# Patient Record
Sex: Female | Born: 1990 | Race: Black or African American | Hispanic: No | Marital: Single | Smoking: Never smoker
Health system: Southern US, Community
[De-identification: ages and names within clinical notes are randomized; demographics above are authoritative.]

## PROBLEM LIST (undated history)

## (undated) DIAGNOSIS — G510 Bell's palsy: Secondary | ICD-10-CM

---
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
## 2010-07-16 NOTE — Assessment & Plan Note (Signed)
Summary: f/u bells palsey,df   Vital Signs:  Patient profile:   20 year old female Menstrual status:  regular Height:      65.5 inches Weight:      248.5 pounds BMI:     40.87 Pulse rate:   92 / minute BP sitting:   126 / 88  (right arm)  Vitals Entered By: Renato Battles slade,cma CC: f/up ED. just finished prednisone. had Bell's palsy Is Patient Diabetic? No Pain Assessment Patient in pain? no        Primary Care Provider:  Helane Rima DO  CC:  f/up ED. just finished prednisone. had Bell's palsy.  History of Present Illness: 20 year old AAF:  1. Bell's Palsy: second episode, similar to prior episode last fall, left face droop x 1 week. Onset was acute, noticed when she awoke in the am. She went to ED and was Tx with Prednisone. Facial droop has not started to get better yet. Last episode took  ~ 3 months to resolve. Difficult to close left eye. Decreased sensation over left side of face. Neg diabetes. Denies pregnancy, rash, recent illness, other neurological deficits.  Habits & Providers  Alcohol-Tobacco-Diet     Tobacco Status: never  Current Medications (verified): 1)  None  Allergies (verified): No Known Drug Allergies  Past History:  Past Medical History: Bell's palsy 2010, 2011 Trichomonas 2010  Social History: Lives with mother and younger sister; denies tobacco, ETOH, drugs; taking driver's education this year (2010); wants to go to college to become a Engineer, civil (consulting); sexually active; does not want birth control at this time.  Review of Systems General:  Denies chills and fever. Eyes:  Denies blurring, double vision, and eye pain. CV:  Denies chest pain or discomfort. Resp:  Denies shortness of breath. GI:  Denies change in bowel habits. Neuro:  See HPI. Psych:  Denies anxiety and depression. Endo:  Denies excessive thirst and excessive urination.  Physical Exam  General:  Obese, WDWN, NAD, vital signs reviewed. Head:  Left face droop with left eyebrow  sagging, inability to completely close the left eye, disappearance of the nasolabial fold, and inability to raise left corner of mouth. Eyes:  No tearing or conjunctival injection. Ears:  TM's pearly gray with normal light reflex and landmarks, canals clear.  Neck:  Supple without adenopathy.  Lungs:  Clear to ausc, no crackles, rhonchi or wheezing, no grunting, flaring or retractions.  Heart:  RRR without murmur.  Neurologic:  alert & oriented X3, strength normal in all extremities, and DTRs symmetrical and normal.   Psych:  Alert and cooperative.    Impression & Recommendations:  Problem # 1:  BELLS PALSY (ICD-351.0) Assessment Unchanged Will send to neurologist for evaluation. Patient has already finished Prednisone burst. Discussed fairly high rate of recurrence with patient. Urine pregnancy negative. Instructions given for eye care. Orders: U Preg-[facility] ([id number]) Neurology Referral (Neuro) [facility]- Est Level  3 (02725)  Problem # 2:  OBESITY (ICD-278.00) Assessment: Unchanged  Patient Instructions: 1)  It looks like you have Bell's Palsy. Since this is your second time, I am sending you to a neurologist for evaluation.  2)  Remember that this should go away in about 6 months or less.  3)  Because your eye may not close completely, it is important that you protect it. You may use artifical tear eye drops during the day and GenTeal Gel Drops at night. You may also tape your eye closed at night. Make sure to use a  gentle, non-abrasive and easily removed tape (such as paper surgical tape).  Laboratory Results   Urine Tests  Date/Time Received: August 28, 2009 4:30 PM  Date/Time Reported: August 28, 2009 4:36 PM     Urine HCG: negative Comments: ...............test performed by......Marland KitchenBonnie A. [place], MLS (ASCP)cm

## 2010-07-16 NOTE — Progress Notes (Signed)
Summary: triage  Phone Note Call from Patient Call back at [phone]   Caller: Patient Summary of Call: pt has a "loud " smell in her urine.  no burning/no pain - wants to know if she has a uti. Initial call taken by: De Nurse,  December 02, 2009 4:40 PM  Follow-up for Phone Call        told her she would need to be seen. wanted appt thursday since that is when she can get a ride. she will come at 8:30 for a work in. advised lots of water & if any pain or discomfort starts, take tylenol or ibuprofen Follow-up by: Golden Circle RN,  December 02, 2009 4:55 PM

## 2010-07-16 NOTE — Assessment & Plan Note (Signed)
Summary: knot in left breast,tcb   Vital Signs:  Patient profile:   20 year old female Menstrual status:  regular Height:      65.5 inches Weight:      250 pounds BMI:     41.12 Temp:     98.1 degrees F oral Pulse rate:   89 / minute BP sitting:   141 / 73  (left arm) Cuff size:   large  Vitals Entered By: Tessie Fass CMA (Nov 04, 2009 11:21 AM) CC: knot in right breast Is Patient Diabetic? No Pain Assessment Patient in pain? no        Primary Care Provider:  Helane Rima DO  CC:  knot in right breast.  History of Present Illness: 20 year old here with her mother noted that her right breast was sore when she layed on it for 1 week.  Feels a small round knot that is tender.  Not on any contraception.  Habits & Providers  Alcohol-Tobacco-Diet     Tobacco Status: never  Current Medications (verified): 1)  None  Allergies (verified): No Known Drug Allergies  Review of Systems      See HPI  Physical Exam  General:  Morbidly obese 20 year old,  BMI 41 Breasts:  large breasts, 1 cm tender mass very tender and palpable beneath areola raound 5 o'clock Axillary Nodes:  no adenopathy   Impression & Recommendations:  Problem # 1:  BREAST CYST, RIGHT (ICD-610.0)  Explained natural occurance of cysts, may also be small infection.  She reports improvement.  Return in 6-8 weeeks if not completely resolved.  If it comes to a head it may need to drain, apply hot compresses.  Orders: [facility]- Est Level  3 ([address])  Problem # 2:  OBESITY (ICD-278.00)  Counseled on weight loss and the risks of such.  Orders: [facility]- Est Level  3 ([address]454)  Patient Instructions: 1)  Return in 6-8 weeks if area is not gone 2)  If it comes to a head apply heat to facilitate drainage.

## 2010-07-16 NOTE — Assessment & Plan Note (Signed)
Summary: wcc,df   Vital Signs:  Patient profile:   20 year old female Menstrual status:  regular LMP:     12/21/2008 Height:      65.5 inches Weight:      237.5 pounds BMI:     39.06 BMI percentile:   98.9 Temp:     99.4 degrees F Pulse rate:   89 / minute BP sitting:   117 / 78  (right arm)  Vitals Entered By: Jacki Cones RN (January 01, 2009 4:47 PM)  Nutrition Counseling: Patient's BMI is greater than 25 and therefore counseled on weight management options.  CC:  WCC.  CC: WCC Pain Assessment Patient in pain? no      LMP (date): 12/21/2008  years   days  Menstrual Status regular Enter LMP: 12/21/2008   Habits & Providers  Alcohol-Tobacco-Diet     Alcohol drinks/day: 0     Tobacco Status: never  Exercise-Depression-Behavior     Does Patient Exercise: no     STD Risk: never     Drug Use: never     Seat Belt Use: always  Well Child Visit/Preventive Care  Age:  20 years old female Patient lives with: mother Concerns: Saxon is a pleasant 74 year old presenting for [facility].   1. Foot Peeling: worries about dry, peeling skin on both feet, states "they a mess!," denies itching, swelling, odor, lesions, numbness/tingling, pain, nail involvement. She uses lotion to moisturize them. She denies other rashes or dry skin on the rest of her body. No polyuria, polydipsia, weight gain or loss.  Home:     good family relationships, communication between adolescent/parent, and has responsibilities at home Education:     As, Bs, and good attendance; wants to become a nurse Activities:     friends and > 2 hrs TV/Computer Auto/Safety:     seatbelts Diet:     dental hygiene/visit addressed; dentist visit one month ago, wants to lose weight Drugs:     no tobacco use, no alcohol use, and no drug use Sex:     abstinence Suicide risk:     emotionally healthy  Personal History: started menses at age 32, length between flow = 28 days, flow x 3 days, not sexually active, not  interested in birth control  Family History: Atopic Disease- Mother  Social History: Lives with mother and younger sister; denies tobacco, ETOH, drugs; taking driver's education this year (2010); wants to go to college to become a Engineer, civil (consulting); not sexually active; does not want birth control at this time.Smoking Status:  never STD Risk:  never Drug Use/Awareness:  never  Review of Systems       per HPI, otherwise negative  Physical Exam  General:      Well appearing adolescent, no acute distress, overweight Head:      normocephalic and atraumatic  Eyes:      PERRL, EOMI Ears:      TM's pearly gray with normal light reflex and landmarks, canals clear  Nose:      Clear without Rhinorrhea Mouth:      Clear without erythema, edema or exudate, mucous membranes moist Neck:      supple without adenopathy  Lungs:      Clear to ausc, no crackles, rhonchi or wheezing, no grunting, flaring or retractions  Heart:      RRR without murmur  Abdomen:      BS+, soft, non-tender, no masses, no hepatosplenomegaly  Musculoskeletal:      no  scoliosis, normal gait, normal posture Pulses:      femoral pulses present  Extremities:      Well perfused with no cyanosis or deformity noted  Neurologic:      Neurologic exam grossly intact  Developmental:      alert and cooperative  Skin:      intact without lesions, rashes  Psychiatric:      alert and cooperative   Impression & Recommendations:  Problem # 1:  Well Adolescent Exam (ICD-V20.2) Assessment Unchanged Normal WCC. Patient does not want birth control at this time. Anticipatory guidance given and questions answered. Obesity addressed (see #2). Follow up in 1 year or sooner if needed.  Problem # 2:  OBESITY (ICD-278.00) Assessment: Unchanged Encouraged regular exercise in the form of walking and light weight training 5 days/week. Discussed healthy food choices. Patient refuses meeting with nutritionist at this time. Will  monitor.  Problem # 3:  DERMATOPHYTOSIS OF FOOT (ICD-110.4) Assessment: New Mild. Advised patient to use OTC antifungals and to keep feet clean and dry. Clean shoes well and allow them to air-out before wearing again. If problem persists or worsens, will may evaluate need for oral therapy.  Other Orders: [facility] - Est  12-17 yrs (04540)  Patient Instructions: 1)  Please schedule a follow-up appointment in 1 year.  2)  You need to lose weight. Consider a lower calorie diet and regular exercise.  ]  Prevention & Chronic Care Immunizations   Influenza vaccine: Not documented    Pneumococcal vaccine: Not documented  Other Screening   Pap smear: Not documented   Pap smear action/deferral: Not indicated-other  (01/01/2009)   Smoking status: never  (01/01/2009)

## 2010-07-16 NOTE — Letter (Signed)
Summary: Lab Results  [facility]  [address]   [place], [place] [address]   Phone: [phone]  Fax: [phone]    02/25/2009  Valerie Hancock [address] [place], [place]  [address]  Dear [name],  I am happy to inform you that your recent lab work was all normal.   Please let me know if you have any questions or if you do not feel that you are getting better.  For further treatment of Bell's Palsy:  Because your eye may not close completely, it is important that you protect it. You may use artifical tear eye drops during the day and GenTeal Gel Drops at night. You may also tape your eye closed at night. Make sure to use a gentle, non-abrasive and easily removed tape (such as paper surgical tape).  How to tape the eye:  1. Apply lubricant 2. Gently close eyelid using a downward motion with the back of your finger. 3. Apply a thin piece of tape from the upper lid to the cheek. 4. You may also apply a thin piece of tape as a sling along the lower eyelid to help support the lower lid and hold tears. (You can also apply tape to the upper eyelid to assist in gravitational closing during the day).   Sincerely,   Helane Rima DO  Appended Document: Lab Results letter mailed.

## 2010-07-16 NOTE — Assessment & Plan Note (Signed)
Summary: smelly urine per pt/[place]/wallace   Vital Signs:  Patient profile:   20 year old female Menstrual status:  regular Height:      65.5 inches Weight:      249.8 pounds BMI:     41.08 Temp:     98.1 degrees F oral Pulse rate:   89 / minute BP sitting:   102 / 69  (left arm) Cuff size:   large  Vitals Entered By: Garen Grams LPN (December 04, 2009 9:29 AM) CC: strong odor to urine, some dysuria x 1 week Is Patient Diabetic? No Pain Assessment Patient in pain? no        Primary Care Provider:  Helane Rima DO  CC:  strong odor to urine and some dysuria x 1 week.  History of Present Illness: 1. odor to urine Noticed a strong smell to her urine since Thursday. Smell has continued, worse in the morning. Has some urethral irritation intermittently but not necessarily associated with urination. Comes and goes.   fevers: no    chills: no    nausea: no    vomiting: no    diarrhea: no back pain: no      reports a whitish vaginal d/c for the last few days. Has had intercourse but reports using condoms. Would like to be checked for STDs.   periods are normal. Came off cycle last week.   PMHx:  Thinks she's had 2 UTIs before.      Habits & Providers  Alcohol-Tobacco-Diet     Tobacco Status: never  Current Medications (verified): 1)  None  Allergies (verified): No Known Drug Allergies  Physical Exam  General:  Vital signs reviewed -- obese but otherwise normal Alert, appropriate; well-dressed and well-nourished  Lungs:  work of breathing unlabored, clear to auscultation bilaterally; no wheezes, rales, or ronchi; good air movement throughout  Heart:  regular rate and rhythm, no murmurs; normal s1/s2  Abdomen:  +BS, soft, non-tender, non-distended; no masses; no rebound or guarding; no suprapubic tenderness; no CVAT Genitalia:  normal external female anatomy; vaginal vault normal for age; moderate white vaginal d/ccervix normal to inspection; no cervical motion  tenderness; no adnexal tenderness or mass; uterus in normal position and normal to palpation.   Skin:  warm, good turgor; no rashes or lesions.    Impression & Recommendations:  Problem # 1:  OTHER NONSPECIFIC FINDING EXAMINATION OF URINE (ICD-791.9) Assessment New  Urine is concentrated and contaminated with epis. Give STD (see below) will not treat for UTI at this time. Discusses signs and symptoms of UTI that should prompt her return. Advised increasing fluid intake. No systemic signs of infection.   [id number] cell  Orders: [facility]- Est  Level 4 ([id number])  Problem # 2:  CONTACT OR EXPOSURE TO OTHER VIRAL DISEASES (ICD-V01.79) Assessment: New wet prep + for trich and + whiff. Will treate with flagyl two times a day x 7 days. Also check other STD labs. Counseled pt on the diagnosis and how it is contracted. Advise that she inform partners so that they can be treated. Advised safe sex practices. Pt expresses agreement and understanding.  Orders: GC/Chlamydia-[facility] ([id number]) HIV-[facility] [id number]) RPR-[facility] ([id number]) Wet Prep- [facility] ([id number]) [facility]- Est  Level 4 ([address])  Complete Medication List: 1)  Metronidazole 500 Mg Tabs (Metronidazole) .... One by mouth two times a day x 7 days  Other Orders: Urinalysis-[facility] (00000)  Patient Instructions: 1)  you have an STD 2)  it's important that your sexual partner(s) be treated  3)  take the medicine two times a day for 7 days 4)  if your urination becomes more painful, if you develop fever/chills or back pain, please call or be seen.  5)  you urine shows that you are dehydrated. You need to make sure that your drinking lots and lots of clear fluids (water is best) Prescriptions: METRONIDAZOLE 500 MG TABS (METRONIDAZOLE) one by mouth two times a day x 7 days  #14 x 0   Entered and Authorized by:   Myrtie Soman  MD   Signed by:   Myrtie Soman  MD on 12/04/2009   Method used:   Electronically to        [facility] [id number]  (retail)       [address] [place], [place]  [address]       Ph: [phone]       Fax: [phone]   RxID:   [id number]    Prevention & Chronic Care Immunizations   Influenza vaccine: Not documented    Tetanus booster: Not documented    Pneumococcal vaccine: Not documented  Other Screening   Pap smear: Not documented   Pap smear action/deferral: Not indicated-other  (01/01/2009)   Smoking status: never  (12/04/2009)   Laboratory Results   Urine Tests  Date/Time Received: December 04, 2009 9:28 AM  Date/Time Reported: December 04, 2009 10:20 AM   Routine Urinalysis   Color: yellow Appearance: Clear Glucose: negative   (Normal Range: Negative) Bilirubin: negative   (Normal Range: Negative) Ketone: negative   (Normal Range: Negative) Spec. Gravity: >=1.030   (Normal Range: 1.003-1.035) Blood: negative   (Normal Range: Negative) pH: 6.0   (Normal Range: 5.0-8.0) Protein: trace   (Normal Range: Negative) Urobilinogen: 0.2   (Normal Range: 0-1) Nitrite: negative   (Normal Range: Negative) Leukocyte Esterace: moderate   (Normal Range: Negative)  Urine Microscopic WBC/HPF: >20 RBC/HPF: 1-5 Bacteria/HPF: 2+ Mucous/HPF: 2+ Epithelial/HPF: >20 Other: few trich    Comments: 6.5 cc spun ...............test performed by......Marland KitchenBonnie A. [place], MLS (ASCP)cm  Date/Time Received: December 04, 2009 10:17 AM  Date/Time Reported: December 04, 2009 10:20 AM   [employer] Source: vaginal WBC/hpf: 10-20 Bacteria/hpf: 3+  Cocci Clue cells/hpf: none  Positive whiff Yeast/hpf: none Trichomonas/hpf: many Comments: rod bacteria also present ...........test performed by...........Marland KitchenTerese Door, CMA

## 2010-07-16 NOTE — Assessment & Plan Note (Signed)
Summary: face droop,df   Vital Signs:  Patient profile:   20 year old female Menstrual status:  regular Height:      65.5 inches Weight:      238.3 pounds Temp:     98.9 degrees F Pulse rate:   80 / minute BP sitting:   120 / 88  (left arm)  Vitals Entered By: Theresia Lo RN (February 21, 2009 1:38 PM) CC: face drooping on left side , numbnesson that side also Is Patient Diabetic? No Pain Assessment Patient in pain? no        Primary Care Provider:  Helane Rima DO  CC:  face drooping on left side  and numbnesson that side also.  History of Present Illness: 20 year old with left face droop x 2 weeks. Onset was acute, noticed when she awoke in the am. She went to 2 different dentists, no dental issue, was instructed to f/u with PCP. Facial droop has recently started to get better. Difficult to close left eye. Decreased sensation over left side of face. No known diabetes. Denies pregnancy, rash, recent illness, other neurological deficits.  Habits & Providers  Alcohol-Tobacco-Diet     Tobacco Status: never  Current Medications (verified): 1)  Prednisone 20 Mg Tabs (Prednisone) .... Take 3 Tabs By Mouth X 5 Days  Allergies (verified): No Known Drug Allergies  Past History:  Social History: Last updated: 01/01/2009 Lives with mother and younger sister; denies tobacco, ETOH, drugs; taking driver's education this year (2010); wants to go to college to become a Engineer, civil (consulting); not sexually active; does not want birth control at this time.  Review of Systems  The patient denies fever, weight loss, weight gain, vision loss, decreased hearing, hoarseness, chest pain, syncope, dyspnea on exertion, prolonged cough, headaches, muscle weakness, and suspicious skin lesions.    Physical Exam  General:      Obese, WDWN, NAD, vital signs reviewed. Head:      Left face droop with left eyebrow sagging, inability to completely close the left eye, disappearance of the nasolabial fold,  and inability to raise left corner of mouth. Eyes:      No tearing or conjunctival injection. Ears:      TM's pearly gray with normal light reflex and landmarks, canals clear  Nose:      Clear without Rhinorrhea Mouth:      Clear without erythema, edema or exudate, mucous membranes moist Neck:      supple without adenopathy  Lungs:      Clear to ausc, no crackles, rhonchi or wheezing, no grunting, flaring or retractions  Heart:      RRR without murmur  Neurologic:      per Head Exam, otherwise intact Skin:      intact without lesions, rashes    Impression & Recommendations:  Problem # 1:  BELLS PALSY (ICD-351.0) Assessment New Educated patient and mother re: diagnosis, prognosis, treatment. Rx: Prednisone. Will refer to neuro if not improving in 6 months. Will also check BMP, CBC. Patient denies pregnancy and declines testing.  Orders: Basic Met-[facility] ([id number]) CBC-[facility] ([id number]) [facility]- Est Level  3 ([id number])  Medications Added to Medication List This Visit: 1)  Prednisone 20 Mg Tabs (Prednisone) .... Take 3 tabs by mouth x 5 days  Patient Instructions: 1)  It looks like you have a condition called Bell's Palsy. 2)  I am sending a prescription to your pharmacy. 3)  Remember tha this should go away in about 6 months. If it  does not, please follow up. 4)  Because your eye may not close completely, it is important that you protect it. You may use artifical tear eye drops during the day and GenTeal Gel Drops at night. You may also tape your eye closed at night. Make sure to use a gentle, non-abrasive and easily removed tape (such as paper surgical tape). Prescriptions: PREDNISONE 20 MG TABS (PREDNISONE) take 3 tabs by mouth x 5 days  #15 x 0   Entered and Authorized by:   Helane Rima MD   Signed by:   Helane Rima MD on 02/21/2009   Method used:   Electronically to        [facility] [id number] (retail)       [address]       [place], [place]   [address]       Ph: [phone]       Fax: [phone]   RxID:   [id number]

## 2010-07-16 NOTE — Miscellaneous (Signed)
Summary: Neuro Report  Patient seen by Dr. Marjory Lies at [facility] for recurrent Bell's Palsy. Plan: MRI brain and IAC (with and without contrast), CBC, CMP, GTT, HbA1c, ANA, ACE, HIV, RPR, B12, TSH. Rx Prednisone. Helane Rima DO  October 02, 2009 10:59 AM

## 2010-07-21 ENCOUNTER — Encounter: Payer: Self-pay | Admitting: Family Medicine

## 2010-07-21 ENCOUNTER — Encounter (INDEPENDENT_AMBULATORY_CARE_PROVIDER_SITE_OTHER): Payer: Medicaid Other | Admitting: Family Medicine

## 2010-07-21 ENCOUNTER — Encounter: Payer: Self-pay | Admitting: *Deleted

## 2010-07-21 DIAGNOSIS — R300 Dysuria: Secondary | ICD-10-CM

## 2010-07-21 DIAGNOSIS — N898 Other specified noninflammatory disorders of vagina: Secondary | ICD-10-CM

## 2010-07-21 DIAGNOSIS — Z20828 Contact with and (suspected) exposure to other viral communicable diseases: Secondary | ICD-10-CM

## 2010-07-21 DIAGNOSIS — Z23 Encounter for immunization: Secondary | ICD-10-CM

## 2010-07-21 LAB — CONVERTED CEMR LAB
Blood in Urine, dipstick: NEGATIVE
Ketones, urine, test strip: NEGATIVE
Nitrite: NEGATIVE
Protein, U semiquant: NEGATIVE
Specific Gravity, Urine: 1.02
WBC Urine, dipstick: NEGATIVE
Whiff Test: POSITIVE

## 2010-07-30 NOTE — Assessment & Plan Note (Signed)
Summary: needs shots and check up,df   Vital Signs:  Patient profile:   20 year old female Menstrual status:  regular Height:      65.5 inches Weight:      250 pounds BMI:     41.12 Temp:     98.5 degrees F oral Pulse rate:   76 / minute BP sitting:   131 / 85  (left arm)  Vitals Entered By: Tessie Fass CMA (July 21, 2010 8:47 AM) CC: physical.  Pain Assessment Patient in pain? no        Primary Care Provider:  Helane Rima DO  CC:  physical. .  History of Present Illness: Here for a flu shot.  Has stong odor to urine + lesbian partner denies discharge or pain  Allergies (verified): No Known Drug Allergies  Physical Exam  General:  obese 20 year old Abdomen:  no CVA tenderness   Impression & Recommendations:  Problem # 1:  VAGINAL DISCHARGE (ICD-623.5) many clues on wet [place], treat with metronidazole 500 mg two times a day for 7 days [facility]  Problem # 2:  DYSURIA (ICD-788.1) normal UA The following medications were removed from the medication list:    Metronidazole 500 Mg Tabs (Metronidazole) ..... One by mouth two times a day x 7 days Her updated medication list for this problem includes:    Metronidazole 500 Mg Tabs (Metronidazole) ..... One tab two times a day for 7 days  Orders: Urinalysis-[facility] (00000)  Complete Medication List: 1)  Metronidazole 500 Mg Tabs (Metronidazole) .... One tab two times a day for 7 days  Other Orders: Flu Vaccine 58yrs + (88416) Admin 1st Vaccine (60630) Prescriptions: METRONIDAZOLE 500 MG TABS (METRONIDAZOLE) one tab two times a day for 7 days  #14 x 0   Entered and Authorized by:   Luretha Murphy NP   Signed by:   Luretha Murphy NP on 07/21/2010   Method used:   Electronically to        [facility] [id number] (retail)       [address]       [place], [place]  [address]       Ph: [phone]       Fax: [phone]   RxID:   [id number]    Orders  Added: 1)  Urinalysis-[facility] [00000] 2)  Flu Vaccine 50yrs + [10626] 3)  Admin 1st Vaccine [90471] 4)  Wet Prep- [facility] [[id number]]   Immunizations Administered:  Influenza Vaccine # 1:    Vaccine Type: Fluvax 3+    Site: left deltoid    Mfr: GlaxoSmithKline    Dose: 0.5 ml    Route: IM    Given by: Tessie Fass CMA    Exp. Date: 12/12/2010    Lot #: [id number]    VIS given: 01/06/10 version given July 21, 2010.  Flu Vaccine Consent Questions:    Do you have a history of severe allergic reactions to this vaccine? no    Any prior history of allergic reactions to egg and/or gelatin? no    Do you have a sensitivity to the preservative Thimersol? no    Do you have a past history of Guillan-Barre Syndrome? no    Do you currently have an acute febrile illness? no    Have you ever had a severe reaction to latex? no    Vaccine information given and explained to patient?  yes    Are you currently pregnant? no   Immunizations Administered:  Influenza Vaccine # 1:    Vaccine Type: Fluvax 3+    Site: left deltoid    Mfr: GlaxoSmithKline    Dose: 0.5 ml    Route: IM    Given by: Tessie Fass CMA    Exp. Date: 12/12/2010    Lot #: [id number]    VIS given: 01/06/10 version given July 21, 2010.  Laboratory Results   Urine Tests  Date/Time Received: July 21, 2010 9:05 AM  Date/Time Reported: July 21, 2010 10:59 AM   Routine Urinalysis   Color: yellow Appearance: Clear Glucose: negative   (Normal Range: Negative) Bilirubin: negative   (Normal Range: Negative) Ketone: negative   (Normal Range: Negative) Spec. Gravity: 1.020   (Normal Range: 1.003-1.035) Blood: negative   (Normal Range: Negative) pH: 7.0   (Normal Range: 5.0-8.0) Protein: negative   (Normal Range: Negative) Urobilinogen: 0.2   (Normal Range: 0-1) Nitrite: negative   (Normal Range: Negative) Leukocyte Esterace: negative   (Normal Range: Negative)    Comments: .........Marland Kitchenbiochemical negative;  microscopic not indicated ...............test performed by......Marland KitchenBonnie A. [place], MLS (ASCP)cm  Date/Time Received: July 21, 2010 9:26 AM  Date/Time Reported: July 21, 2010 12:30 PM   [place] Source: vag WBC/hpf: 1-5 Bacteria/hpf: 3+  Cocci Clue cells/hpf: many  Positive whiff Yeast/hpf: none Trichomonas/hpf: none Comments: ...............test performed by......Marland KitchenBonnie A. [place], MLS (ASCP)cm

## 2010-07-31 ENCOUNTER — Encounter: Payer: Self-pay | Admitting: Family Medicine

## 2010-08-05 NOTE — Miscellaneous (Signed)
  Clinical Lists Changes  Orders: Added new Test order of [facility]- Est Level  3 (99213) - Signed 

## 2010-11-24 ENCOUNTER — Emergency Department (HOSPITAL_COMMUNITY)
Admission: EM | Admit: 2010-11-24 | Discharge: 2010-11-24 | Disposition: A | Discharge status: Home / Self Care | Payer: Medicaid Other | Attending: Emergency Medicine | Admitting: Emergency Medicine

## 2010-11-24 DIAGNOSIS — R079 Chest pain, unspecified: Secondary | ICD-10-CM | POA: Insufficient documentation

## 2010-11-24 DIAGNOSIS — M549 Dorsalgia, unspecified: Secondary | ICD-10-CM | POA: Insufficient documentation

## 2010-11-24 DIAGNOSIS — R071 Chest pain on breathing: Secondary | ICD-10-CM | POA: Insufficient documentation

## 2011-08-29 ENCOUNTER — Encounter (HOSPITAL_COMMUNITY): Payer: Self-pay | Admitting: Emergency Medicine

## 2011-08-29 ENCOUNTER — Other Ambulatory Visit: Payer: Self-pay

## 2011-08-29 ENCOUNTER — Emergency Department (HOSPITAL_COMMUNITY)
Admission: EM | Admit: 2011-08-29 | Discharge: 2011-08-29 | Disposition: A | Discharge status: Home / Self Care | Payer: Self-pay | Attending: Emergency Medicine | Admitting: Emergency Medicine

## 2011-08-29 ENCOUNTER — Emergency Department (HOSPITAL_COMMUNITY): Payer: Self-pay

## 2011-08-29 DIAGNOSIS — R200 Anesthesia of skin: Secondary | ICD-10-CM

## 2011-08-29 DIAGNOSIS — F411 Generalized anxiety disorder: Secondary | ICD-10-CM | POA: Insufficient documentation

## 2011-08-29 DIAGNOSIS — I498 Other specified cardiac arrhythmias: Secondary | ICD-10-CM | POA: Insufficient documentation

## 2011-08-29 DIAGNOSIS — G510 Bell's palsy: Secondary | ICD-10-CM | POA: Insufficient documentation

## 2011-08-29 DIAGNOSIS — R209 Unspecified disturbances of skin sensation: Secondary | ICD-10-CM | POA: Insufficient documentation

## 2011-08-29 HISTORY — DX: Bell's palsy: G51.0

## 2011-08-29 LAB — CBC
HCT: 35.4 % — ABNORMAL LOW (ref 36.0–46.0)
Hemoglobin: 12 g/dL (ref 12.0–15.0)
MCHC: 33.9 g/dL (ref 30.0–36.0)

## 2011-08-29 LAB — BASIC METABOLIC PANEL
BUN: 15 mg/dL (ref 6–23)
CO2: 21 mEq/L (ref 19–32)
Chloride: 105 mEq/L (ref 96–112)
Creatinine, Ser: 0.74 mg/dL (ref 0.50–1.10)
GFR calc Af Amer: >90 mL/min (ref >90)
Glucose, Bld: 118 mg/dL — ABNORMAL HIGH (ref 70–99)

## 2011-08-29 LAB — DIFFERENTIAL
Basophils Relative: 0 % (ref 0–1)
Monocytes Absolute: 0.8 10*3/uL (ref 0.1–1.0)
Monocytes Relative: 7 % (ref 3–12)
Neutro Abs: 7.9 10*3/uL — ABNORMAL HIGH (ref 1.7–7.7)

## 2011-08-29 NOTE — Discharge Instructions (Signed)
Bell's Palsy  Bell's palsy is a condition in which the muscles on one side of the face cannot move (paralysis). This is because the nerves in the face are paralyzed. It is most often thought to be caused by a virus. The virus causes swelling of the nerve that controls movement on one side of the face. The nerve travels through a tight space surrounded by bone. When the nerve swells, it can be compressed by the bone. This results in damage to the protective covering around the nerve. This damage interferes with how the nerve communicates with the muscles of the face. As a result, it can cause weakness or paralysis of the facial muscles.   Injury (trauma), tumor, and surgery may cause Bell's palsy, but most of the time the cause is unknown. It is a relatively common condition. It starts suddenly (abrupt onset) with the paralysis usually ending within 2 days. Bell's palsy is not dangerous. But because the eye does not close properly, you may need care to keep the eye from getting dry. This can include splinting (to keep the eye shut) or moistening with artificial tears. Bell's palsy very seldom occurs on both sides of the face at the same time.  SYMPTOMS    Eyebrow sagging.   Drooping of the eyelid and corner of the mouth.   Inability to close one eye.   Loss of taste on the front of the tongue.   Sensitivity to loud noises.  TREATMENT   The treatment is usually non-surgical. If the patient is seen within the first 24 to 48 hours, a short course of steroids may be prescribed, in an attempt to shorten the length of the condition. Antiviral medicines may also be used with the steroids, but it is unclear if they are helpful.   You will need to protect your eye, if you cannot close it. The cornea (clear covering over your eye) will become dry and can be damaged. Artificial tears can be used to keep your eye moist. Glasses or an eye patch should be worn to protect your eye.  PROGNOSIS   Recovery is variable, ranging  from days to months. Although the problem usually goes away completely (about 80% of cases resolve), predicting the outcome is impossible. Most people improve within 3 weeks of when the symptoms began. Improvement may continue for 3 to 6 months. A small number of people have moderate to severe weakness that is permanent.   HOME CARE INSTRUCTIONS    If your caregiver prescribed medication to reduce swelling in the nerve, use as directed. Do not stop taking the medication unless directed by your caregiver.   Use moisturizing eye drops as needed to prevent drying of your eye, as directed by your caregiver.   Protect your eye, as directed by your caregiver.   Use facial massage and exercises, as directed by your caregiver.   Perform your normal activities, and get your normal rest.  SEEK IMMEDIATE MEDICAL CARE IF:    There is pain, redness or irritation in the eye.   You or your child has an oral temperature above 102 F (38.9 C), not controlled by medicine.  MAKE SURE YOU:    Understand these instructions.   Will watch your condition.   Will get help right away if you are not doing well or get worse.  Document Released: 05/31/2005 Document Revised: 05/20/2011 Document Reviewed: 06/09/2009  [employer] Patient Information 2012 [employer].

## 2011-08-29 NOTE — ED Notes (Signed)
Patient driving this evening, having a feeling of anxiety and pulled over.  Patient does have history of Bell's Palsy, normal for some slurred speech.  Patient follows commands appropriately and is CAOx3.

## 2011-08-29 NOTE — ED Provider Notes (Signed)
History     CSN: [id number]  Arrival date & time 08/29/11  0214   First MD Initiated Contact with Patient 08/29/11 0413      Chief Complaint  Patient presents with  . Anxiety     HPI numbness Onset - just prior to arrival Course - stable Worsened by - nothing Improved by nothing Associated symptoms - anxiety Denies headache/cp/syncope/focal weakness  Pt with h/o bell's palsy with left sided facial droop Tonight was driving, felt anxious and felt numbness throughout left side of body No HA No visual changes Denies h/o numbness before Denies neck/back pain   Past Medical History  Diagnosis Date  . Bell palsy     No past surgical history on file.  No family history on file.  History  Substance Use Topics  . Smoking status: Not on file  . Smokeless tobacco: Not on file  . Alcohol Use:     OB History    Grav Para Term Preterm Abortions TAB SAB Ect Mult Living                  Review of Systems  All other systems reviewed and are negative.    Allergies  Review of patient's allergies indicates no known allergies.  Home Medications  No current outpatient prescriptions on file.  BP 106/59  Pulse 100  Temp(Src) 99.5 F (37.5 C) (Oral)  Resp 41  SpO2 98%  LMP 08/11/2011  Physical Exam CONSTITUTIONAL: Well developed/well nourished HEAD AND FACE: Normocephalic/atraumatic EYES: EOMI/PERRL ENMT: Mucous membranes moist NECK: supple no meningeal signs, no bruits noted SPINE:entire spine nontender CV: S1/S2 noted, no murmurs/rubs/gallops noted LUNGS: Lungs are clear to auscultation bilaterally, no apparent distress ABDOMEN: soft, nontender, no rebound or guarding GU:no cva tenderness NEURO: Pt is awake/alert, moves all extremitiesx4.  No arm/left drift is noted.  Left facial droop noted (chronic) She is able to speak, no aphasia noted.  No dysarthria is noted.  She reports numbness to her left face/arm/leg EXTREMITIES: pulses normal, full ROM SKIN:  warm, color normal PSYCH: no abnormalities of mood noted  ED Course  Procedures   Labs Reviewed  CBC - Abnormal; Notable for the following:    WBC 11.1 (*)    HCT 35.4 (*)    All other components within normal limits  DIFFERENTIAL - Abnormal; Notable for the following:    Neutro Abs 7.9 (*)    All other components within normal limits  BASIC METABOLIC PANEL - Abnormal; Notable for the following:    Glucose, Bld 118 (*)    All other components within normal limits     Pt improved, ambulatory No focal motor deficits I elected to perform CT head as she reports no recent neuroimaging and has had bell's palsy for some time Stable left facial droop (chronic from bell's palsy) Vitals appropriate (did not have RR of 41 on my exam) Suspicion for acute neurologic process (CVA/GBS) is low given history/exam.  She had only reported numbness and no focal deficits were noted on exam. She reports numbness is improved   The patient appears reasonably screened and/or stabilized for discharge and I doubt any other medical condition or other [facility] requiring further screening, evaluation, or treatment in the ED at this time prior to discharge.    MDM  Nursing notes reviewed and considered in documentation All labs/vitals reviewed and considered        Date: 08/29/2011  Rate: 105  Rhythm: sinus tachycardia  QRS Axis: normal  Intervals:  normal  ST/T Wave abnormalities: nonspecific ST changes  Conduction Disutrbances:none  Narrative Interpretation:   Old EKG Reviewed: none available    Joya Gaskins, MD 08/29/11 [phone]

## 2012-05-18 ENCOUNTER — Emergency Department (HOSPITAL_COMMUNITY)
Admission: EM | Admit: 2012-05-18 | Discharge: 2012-05-18 | Disposition: A | Discharge status: Home / Self Care | Payer: Self-pay | Attending: Emergency Medicine | Admitting: Emergency Medicine

## 2012-05-18 ENCOUNTER — Encounter (HOSPITAL_COMMUNITY): Payer: Self-pay

## 2012-05-18 DIAGNOSIS — J029 Acute pharyngitis, unspecified: Secondary | ICD-10-CM

## 2012-05-18 DIAGNOSIS — R1310 Dysphagia, unspecified: Secondary | ICD-10-CM | POA: Insufficient documentation

## 2012-05-18 DIAGNOSIS — R490 Dysphonia: Secondary | ICD-10-CM | POA: Insufficient documentation

## 2012-05-18 MED ORDER — ACETAMINOPHEN-CODEINE #3 300-30 MG PO TABS: 1.0000 | ORAL_TABLET | Freq: Four times a day (QID) | ORAL | Status: DC | PRN

## 2012-05-18 MED ORDER — PENICILLIN G BENZATHINE 1200000 UNIT/2ML IM SUSP
1.2000 10*6.[IU] | Freq: Once | INTRAMUSCULAR | Status: AC
  Administered 2012-05-18: 1.2 10*6.[IU] via INTRAMUSCULAR
  Filled 2012-05-18: qty 2

## 2012-05-18 NOTE — ED Notes (Signed)
Pt presents with 4 day h/o difficulty swallowing.  Pt able to handle secretions, has muffled voice.  Pt denies sick contact, denies any nasal congestion.

## 2012-05-18 NOTE — ED Provider Notes (Signed)
History   This chart was scribed for Gavin Pound. Oletta Lamas, MD by Sofie Rower, ED Scribe. The patient was seen in room TR06C/TR06C and the patient's care was started at 1:49PM.       CSN: [id number]  Arrival date & time 05/18/12  1328   First MD Initiated Contact with Patient 05/18/12 1349      Chief Complaint  Patient presents with  . Sore Throat    (Consider location/radiation/quality/duration/timing/severity/associated sxs/prior treatment) HPI  Valerie Hancock is a 21 y.o. female , with a hx of seasonal allergies and Bell palsy, who presents to the Emergency Department complaining of sudden, progressively worsening, sore throat, onset three days ago (05/15/12).  Associated symptoms include difficulty swallowing and voice change. The pt reports her voice has become slightly hoarse over the past three days in addition to generating a difficulty swallowing, which has prompted her concern to receive medical evaluation at [facility].   The pt denies fever, chills, and sweats.   The pt does not smoke, however, she does drink alcohol.   PCP is Dr. Gwenlyn Saran.    Past Medical History  Diagnosis Date  . Bell palsy     History reviewed. No pertinent past surgical history.  History reviewed. No pertinent family history.  History  Substance Use Topics  . Smoking status: Never Smoker   . Smokeless tobacco: Not on file  . Alcohol Use: Yes    OB History    Grav Para Term Preterm Abortions TAB SAB Ect Mult Living                  Review of Systems  Constitutional: Negative for fever, chills and diaphoresis.  HENT: Positive for sore throat, trouble swallowing and voice change. Negative for congestion.   Respiratory: Negative for cough.   Gastrointestinal: Negative for vomiting.    Allergies  Review of patient's allergies indicates no known allergies.  Home Medications   Current Outpatient Rx  Name  Route  Sig  Dispense  Refill  . IBUPROFEN 200 MG PO TABS   Oral   Take 200 mg by mouth  every 6 (six) hours as needed. For pain         . HYDROCODONE-ACETAMINOPHEN 5-325 MG PO TABS      1 or 2 tabs PO q6 hours prn pain   20 tablet   0   . OVER THE COUNTER MEDICATION   Oral   Take 1 tablet by mouth daily as needed. Over the counter allergy medication           BP 129/76  Pulse 79  Temp 98.7 F (37.1 C) (Oral)  Resp 20  SpO2 100%  LMP 05/08/2012  Physical Exam  Nursing note and vitals reviewed. Constitutional: She appears well-developed and well-nourished. No distress.  HENT:  Head: Atraumatic.  Right Ear: Tympanic membrane and ear canal normal.  Left Ear: Tympanic membrane and ear canal normal.  Nose: Nose normal.  Mouth/Throat: Uvula is midline. Posterior oropharyngeal erythema present.  Neck: Normal range of motion. Neck supple.       Submandibular lymph nodes detected.   Cardiovascular: Normal rate, regular rhythm and normal heart sounds.   Pulmonary/Chest: Effort normal and breath sounds normal. She has no wheezes.  Lymphadenopathy:    She has cervical adenopathy.  Neurological: She is alert.  Skin: Skin is warm and dry. She is not diaphoretic.    ED Course  Procedures (including critical care time)  DIAGNOSTIC STUDIES: Oxygen Saturation is 100%  on room air, normal by my interpretation.    COORDINATION OF CARE:   1:56 PM- Treatment plan discussed with patient. Pt agrees with treatment.     Labs Reviewed - No data to display No results found.   1. Pharyngitis       MDM  I personally performed the services described in this documentation, which was scribed in my presence. The recorded information has been reviewed and is accurate.    Gavin Pound. Oletta Lamas, MD 05/23/12 [phone]

## 2012-05-18 NOTE — ED Notes (Signed)
Pt c/o sore throat X 4 days. sts she has tried OTC meds but hasn't gotten any relief and now it is hard for her to sleep. Pt sts it is painful to swallow.

## 2012-05-18 NOTE — Discharge Instructions (Signed)
 Salt Water Gargle This solution will help make your mouth and throat feel better. HOME CARE INSTRUCTIONS   Mix 1 teaspoon of salt in 8 ounces of warm water.  Gargle with this solution as much or often as you need or as directed. Swish and gargle gently if you have any sores or wounds in your mouth.  Do not swallow this mixture. Document Released: 03/04/2004 Document Revised: 08/23/2011 Document Reviewed: 07/26/2008 [facility] Patient Information 2013 [place], [place].   Viral and Bacterial Pharyngitis Pharyngitis is soreness (inflammation) or infection of the pharynx. It is also called a sore throat. CAUSES  Most sore throats are caused by viruses and are part of a cold. However, some sore throats are caused by strep and other bacteria. Sore throats can also be caused by post nasal drip from draining sinuses, allergies and sometimes from sleeping with an open mouth. Infectious sore throats can be spread from person to person by coughing, sneezing and sharing cups or eating utensils. TREATMENT  Sore throats that are viral usually last 3-4 days. Viral illness will get better without medications (antibiotics). Strep throat and other bacterial infections will usually begin to get better about 24-48 hours after you begin to take antibiotics. HOME CARE INSTRUCTIONS   If the caregiver feels there is a bacterial infection or if there is a positive strep test, they will prescribe an antibiotic. The full course of antibiotics must be taken. If the full course of antibiotic is not taken, you or your child may become ill again. If you or your child has strep throat and do not finish all of the medication, serious heart or kidney diseases may develop.  Drink enough water and fluids to keep your urine clear or pale yellow.  Only take over-the-counter or prescription medicines for pain, discomfort or fever as directed by your caregiver.  Get lots of rest.  Gargle with salt water ( tsp. of salt in a glass  of water) as often as every 1-2 hours as you need for comfort.  Hard candies may soothe the throat if individual is not at risk for choking. Throat sprays or lozenges may also be used. SEEK MEDICAL CARE IF:   Large, tender lumps in the neck develop.  A rash develops.  Green, yellow-brown or bloody sputum is coughed up.  Your baby is older than 3 months with a rectal temperature of 100.5 F (38.1 C) or higher for more than 1 day. SEEK IMMEDIATE MEDICAL CARE IF:   A stiff neck develops.  You or your child are drooling or unable to swallow liquids.  You or your child are vomiting, unable to keep medications or liquids down.  You or your child has severe pain, unrelieved with recommended medications.  You or your child are having difficulty breathing (not due to stuffy nose).  You or your child are unable to fully open your mouth.  You or your child develop redness, swelling, or severe pain anywhere on the neck.  You have a fever.   MAKE SURE YOU:   Understand these instructions.  Will watch your condition.  Will get help right away if you are not doing well or get worse. Document Released: 05/31/2005 Document Revised: 08/23/2011 Document Reviewed: 08/28/2007 [facility] Patient Information 2013 [place], [place].

## 2012-05-20 ENCOUNTER — Emergency Department (HOSPITAL_COMMUNITY)
Admission: EM | Admit: 2012-05-20 | Discharge: 2012-05-20 | Disposition: A | Discharge status: Home / Self Care | Payer: Self-pay | Attending: Emergency Medicine | Admitting: Emergency Medicine

## 2012-05-20 ENCOUNTER — Emergency Department (HOSPITAL_COMMUNITY): Payer: Self-pay

## 2012-05-20 ENCOUNTER — Encounter (HOSPITAL_COMMUNITY): Payer: Self-pay | Admitting: *Deleted

## 2012-05-20 DIAGNOSIS — Z8669 Personal history of other diseases of the nervous system and sense organs: Secondary | ICD-10-CM | POA: Insufficient documentation

## 2012-05-20 DIAGNOSIS — R221 Localized swelling, mass and lump, neck: Secondary | ICD-10-CM | POA: Insufficient documentation

## 2012-05-20 DIAGNOSIS — J029 Acute pharyngitis, unspecified: Secondary | ICD-10-CM | POA: Insufficient documentation

## 2012-05-20 DIAGNOSIS — R220 Localized swelling, mass and lump, head: Secondary | ICD-10-CM | POA: Insufficient documentation

## 2012-05-20 DIAGNOSIS — Z3202 Encounter for pregnancy test, result negative: Secondary | ICD-10-CM | POA: Insufficient documentation

## 2012-05-20 LAB — URINALYSIS, ROUTINE W REFLEX MICROSCOPIC
Bilirubin Urine: NEGATIVE
Glucose, UA: NEGATIVE mg/dL
Hgb urine dipstick: NEGATIVE
Ketones, ur: NEGATIVE mg/dL
pH: 7.5 (ref 5.0–8.0)

## 2012-05-20 LAB — URINE MICROSCOPIC-ADD ON

## 2012-05-20 LAB — POCT I-STAT, CHEM 8
BUN: 11 mg/dL (ref 6–23)
Chloride: 104 mEq/L (ref 96–112)
Potassium: 4 mEq/L (ref 3.5–5.1)
Sodium: 140 mEq/L (ref 135–145)

## 2012-05-20 LAB — RAPID STREP SCREEN (MED CTR MEBANE ONLY): Streptococcus, Group A Screen (Direct): NEGATIVE

## 2012-05-20 MED ORDER — HYDROCODONE-ACETAMINOPHEN 5-325 MG PO TABS: ORAL_TABLET | ORAL | Status: DC

## 2012-05-20 MED ORDER — IOHEXOL 300 MG/ML  SOLN
75.0000 mL | Freq: Once | INTRAMUSCULAR | Status: AC | PRN
  Administered 2012-05-20: 75 mL via INTRAVENOUS

## 2012-05-20 MED ORDER — DEXAMETHASONE 2 MG PO TABS
4.0000 mg | ORAL_TABLET | Freq: Once | ORAL | Status: AC
  Administered 2012-05-20: 4 mg via ORAL
  Filled 2012-05-20: qty 2

## 2012-05-20 NOTE — ED Notes (Signed)
Pt reports being seen on 12/5, diagnosed with strep throat and given IM penicillin. Now having swelling to left side of face and neck, airway intact at triage, spo2 100%

## 2012-05-20 NOTE — ED Provider Notes (Signed)
History   This chart was scribed for Laray Anger, DO by Gerlean Ren, ED Scribe. This patient was seen in room TR07C/TR07C and the patient's care was started at 1:28 PM    CSN: [id number]  Arrival date & time 05/20/12  1237   First MD Initiated Contact with Patient 05/20/12 1315      Chief Complaint  Patient presents with  . Sore Throat  . Facial Swelling     The history is provided by the patient. No language interpreter was used.   Valerie Hancock is a 21 y.o. female who presents to the Emergency Department complaining of gradual onset and persistence of constant sore throat for the past 6 days.  Pt was eval in the ED 2 days ago for same, dx "strep throat," and tx with IM PCN.  Pt states the pain on the right side of her throat has improved, but she continues to have pain on the left side of her throat with swelling over the left side of her neck.  Denies fevers, no hoarse voice, no drooling, no stridor, no SOB/wheezing, no cough, no rash, no dysphagia, no neck pain.    Past Medical History  Diagnosis Date  . Bell palsy     History reviewed. No pertinent past surgical history.   History  Substance Use Topics  . Smoking status: Never Smoker   . Smokeless tobacco: Not on file  . Alcohol Use: Yes    Review of Systems ROS: Statement: All systems negative except as marked or noted in the HPI; Constitutional: Negative for fever and chills. ; ; Eyes: Negative for eye pain, redness and discharge. ; ; ENMT: Negative for ear pain, hoarseness, nasal congestion, sinus pressure and +sore throat. ; ; Cardiovascular: Negative for chest pain, palpitations, diaphoresis, dyspnea and peripheral edema. ; ; Respiratory: Negative for cough, wheezing and stridor. ; ; Gastrointestinal: Negative for nausea, vomiting, diarrhea, abdominal pain, blood in stool, hematemesis, jaundice and rectal bleeding. . ; ; Genitourinary: Negative for dysuria, flank pain and hematuria. ; ; Musculoskeletal:  Negative for back pain and neck pain. Negative for swelling and trauma.; ; Skin: Negative for pruritus, rash, abrasions, blisters, bruising and skin lesion.; ; Neuro: Negative for headache, lightheadedness and neck stiffness. Negative for weakness, altered level of consciousness , altered mental status, extremity weakness, paresthesias, involuntary movement, seizure and syncope.       Allergies  Review of patient's allergies indicates no known allergies.  Home Medications   Current Outpatient Rx  Name  Route  Sig  Dispense  Refill  . IBUPROFEN 200 MG PO TABS   Oral   Take 200 mg by mouth every 6 (six) hours as needed. For pain           BP 141/83  Pulse 96  Temp 97.7 F (36.5 C) (Oral)  Resp 18  SpO2 100%  LMP 05/08/2012  Physical Exam 1330: Physical examination:  Nursing notes reviewed; Vital signs and O2 SAT reviewed;  Constitutional: Well developed, Well nourished, Well hydrated, In no acute distress; Head:  Normocephalic, atraumatic; Eyes: EOMI, PERRL, No scleral icterus; ENMT: Mouth and pharynx without lesions.  Posterior pharynx mildly erythematous, no intra-oral edema, no hoarse voice, no drooling, no stridor. No submandibular edema. Mucous membranes moist; Neck: Supple, Full range of motion, +bilat post cervical lymphadenopathy; Cardiovascular: Regular rate and rhythm, No murmur, rub, or gallop; Respiratory: Breath sounds clear & equal bilaterally, No rales, rhonchi, wheezes.  Speaking full sentences with ease, Normal respiratory  effort/excursion; Chest: Nontender, Movement normal;; Extremities: Pulses normal, No tenderness, No edema, No calf edema or asymmetry.; Neuro: AA&Ox3, Major CN grossly intact.  Speech clear. No gross focal motor or sensory deficits in extremities.; Skin: Color normal, Warm, Dry.   ED Course  Procedures   MDM  MDM Reviewed: previous chart, nursing note and vitals Interpretation: labs and CT scan   Results for orders placed during the  hospital encounter of 05/20/12  URINALYSIS, ROUTINE W REFLEX MICROSCOPIC      Component Value Range   Color, Urine YELLOW  YELLOW   APPearance CLOUDY (*) CLEAR   Specific Gravity, Urine 1.026  1.005 - 1.030   pH 7.5  5.0 - 8.0   Glucose, UA NEGATIVE  NEGATIVE mg/dL   Hgb urine dipstick NEGATIVE  NEGATIVE   Bilirubin Urine NEGATIVE  NEGATIVE   Ketones, ur NEGATIVE  NEGATIVE mg/dL   Protein, ur NEGATIVE  NEGATIVE mg/dL   Urobilinogen, UA 1.0  0.0 - 1.0 mg/dL   Nitrite NEGATIVE  NEGATIVE   Leukocytes, UA SMALL (*) NEGATIVE  PREGNANCY, URINE      Component Value Range   Preg Test, Ur NEGATIVE  NEGATIVE  MONONUCLEOSIS SCREEN      Component Value Range   Mono Screen NEGATIVE  NEGATIVE  RAPID STREP SCREEN      Component Value Range   Streptococcus, Group A Screen (Direct) NEGATIVE  NEGATIVE  POCT I-STAT, CHEM 8      Component Value Range   Sodium 140  135 - 145 mEq/L   Potassium 4.0  3.5 - 5.1 mEq/L   Chloride 104  96 - 112 mEq/L   BUN 11  6 - 23 mg/dL   Creatinine, Ser 4.09  0.50 - 1.10 mg/dL   Glucose, Bld 87  70 - 99 mg/dL   Calcium, Ion 8.11  9.14 - 1.23 mmol/L   TCO2 27  0 - 100 mmol/L   Hemoglobin 12.2  12.0 - 15.0 g/dL   HCT 78.2  95.6 - 21.3 %  URINE MICROSCOPIC-ADD ON      Component Value Range   Squamous Epithelial / LPF MANY (*) RARE   WBC, UA 3-6  <3 WBC/hpf   Bacteria, UA RARE  RARE   Casts GRANULAR CAST (*) NEGATIVE   Ct Soft Tissue Neck W Contrast 05/20/2012  *RADIOLOGY REPORT*  Clinical Data: Strep throat.  Left sided face and neck swelling.  CT NECK WITH CONTRAST  Technique:  Multidetector CT imaging of the neck was performed with intravenous contrast.  Contrast: 75mL OMNIPAQUE IOHEXOL 300 MG/ML  SOLN  Comparison: None  Findings: The visualized portion of the brain is unremarkable.  The skull base is normal.  The paranasal sinuses are clear.  The parotid and submandibular glands are normal and symmetric.  No inflammatory changes.  The tonsils are enlarged and that  touching in the midline.  The adenoids are also enlarged.  There are bilateral borderline enlarged jugulodigastric and posterior cervical lymph nodes.  These are likely inflammatory/hyperplastic.  No neck masses identified. The major vascular structures are normal.  The epiglottis is normal and the paraglottic fat planes are maintained.  No abnormal prevertebral or retropharyngeal soft tissue swelling or mass.  No supraclavicular mass or adenopathy.  Scattered lymph nodes are noted.  The lung apices are clear.  IMPRESSION:  1.  Enlarged tonsils, adenoids and neck nodes suggesting pharyngitis / tonsillitis and cervical adenitis. 2.  No neck mass or abscess.   Original Report Authenticated By:  Rudie Meyer, M.D.      1600:  CT with tonsillitis, but no abscess.  Pt already treated with IM PCN 2 days ago.  Mono and rapid strep negative.  Will tx symptomatically at this time. Dx and testing d/w pt.  Questions answered.  Verb understanding, agreeable to d/c home with outpt f/u.      I personally performed the services described in this documentation, which was scribed in my presence. The recorded information has been reviewed and is accurate.        Laray Anger, DO 05/21/12 1422

## 2012-12-13 ENCOUNTER — Emergency Department (HOSPITAL_COMMUNITY)
Admission: EM | Admit: 2012-12-13 | Discharge: 2012-12-13 | Disposition: A | Discharge status: Home / Self Care | Payer: Self-pay | Attending: Emergency Medicine | Admitting: Emergency Medicine

## 2012-12-13 ENCOUNTER — Emergency Department (HOSPITAL_COMMUNITY): Payer: Self-pay

## 2012-12-13 ENCOUNTER — Encounter (HOSPITAL_COMMUNITY): Payer: Self-pay | Admitting: *Deleted

## 2012-12-13 DIAGNOSIS — Z8669 Personal history of other diseases of the nervous system and sense organs: Secondary | ICD-10-CM | POA: Insufficient documentation

## 2012-12-13 DIAGNOSIS — R072 Precordial pain: Secondary | ICD-10-CM | POA: Insufficient documentation

## 2012-12-13 DIAGNOSIS — R079 Chest pain, unspecified: Secondary | ICD-10-CM

## 2012-12-13 MED ORDER — OMEPRAZOLE 20 MG PO CPDR: 20.0000 mg | DELAYED_RELEASE_CAPSULE | Freq: Every day | ORAL | Status: DC

## 2012-12-13 NOTE — ED Provider Notes (Signed)
Medical screening examination/treatment/procedure(s) were performed by non-physician practitioner and as supervising physician I was immediately available for consultation/collaboration.  Ivan Lacher, MD 12/13/12 1617 

## 2012-12-13 NOTE — ED Notes (Signed)
Pt reports mid upper chest pains for extended amount of time but it has gotten worse. Pain increases with movement, no acute distress noted at triage, ekg done

## 2012-12-13 NOTE — ED Provider Notes (Signed)
History    CSN: [id number] Arrival date & time 12/13/12  1024  First MD Initiated Contact with Patient 12/13/12 1113     Chief Complaint  Patient presents with  . Chest Pain   (Consider location/radiation/quality/duration/timing/severity/associated sxs/prior Treatment) Patient is a 22 y.o. female presenting with chest pain. The history is provided by the patient. No language interpreter was used.  Chest Pain Pain location:  Substernal area Pain quality: sharp   Duration:  2 months Associated symptoms: no back pain, no fever, no nausea, no palpitations, no shortness of breath and not vomiting   Associated symptoms comment:  Intermittent pain for the past 2 months that occurs approximately twice per week lasting a matter of minutes. She is not taking anything for the pain. No SOB, N, cough, fever or aggravating/alleviating factors.   Past Medical History  Diagnosis Date  . Bell palsy    History reviewed. No pertinent past surgical history. History reviewed. No pertinent family history. History  Substance Use Topics  . Smoking status: Never Smoker   . Smokeless tobacco: Not on file  . Alcohol Use: Yes   OB History   Grav Para Term Preterm Abortions TAB SAB Ect Mult Living                 Review of Systems  Constitutional: Negative for fever and chills.  HENT: Negative.   Respiratory: Negative.  Negative for shortness of breath.   Cardiovascular: Positive for chest pain. Negative for palpitations and leg swelling.  Gastrointestinal: Negative.  Negative for nausea and vomiting.  Musculoskeletal: Negative.  Negative for back pain.    Allergies  Review of patient's allergies indicates no known allergies.  Home Medications   Current Outpatient Rx  Name  Route  Sig  Dispense  Refill  . HYDROcodone-acetaminophen (NORCO/VICODIN) 5-325 MG per tablet      1 or 2 tabs PO q6 hours prn pain   20 tablet   0   . ibuprofen (ADVIL,MOTRIN) 200 MG tablet   Oral   Take 200 mg  by mouth every 6 (six) hours as needed. For pain         . OVER THE COUNTER MEDICATION   Oral   Take 1 tablet by mouth daily as needed. Over the counter allergy medication          BP 130/78  Pulse 97  Temp(Src) 98.2 F (36.8 C) (Oral)  Resp 18  SpO2 98%  LMP 12/13/2012 Physical Exam  Constitutional: She is oriented to person, place, and time. She appears well-developed and well-nourished.  HENT:  Head: Normocephalic.  Neck: Normal range of motion. Neck supple.  Cardiovascular: Normal rate and regular rhythm.   Pulmonary/Chest: Effort normal and breath sounds normal. She exhibits tenderness.  Abdominal: Soft. Bowel sounds are normal. There is no tenderness. There is no rebound and no guarding.  Musculoskeletal: Normal range of motion. She exhibits no edema.  Neurological: She is alert and oriented to person, place, and time.  Skin: Skin is warm and dry. No rash noted.  Psychiatric: She has a normal mood and affect.    ED Course  Procedures (including critical care time) Labs Reviewed - No data to display  Date: 12/13/2012  Rate: 72  Rhythm: normal sinus rhythm  QRS Axis: normal  Intervals: normal  ST/T Wave abnormalities: normal  Conduction Disutrbances:none  Narrative Interpretation:   Old EKG Reviewed: none available   Dg Chest 2 View  12/13/2012   *RADIOLOGY REPORT*  Clinical Data: Upper mid chest pain and shortness of breath.  CHEST - 2 VIEW  Comparison: None.  Findings: Normal sized heart.  Clear lungs with normal vascularity. Minimal scoliosis.  IMPRESSION: No acute abnormality.   Original Report Authenticated By: Beckie Salts, M.D.  ENo diagnosis found. 1. Chest pain MDM  Normal EKG, CXR in a patient with symptoms aytpical for ACS, pulmonary condition. DDx musculoskeletal vs GI source of pain.  Arnoldo Hooker, PA-C 12/13/12 1314

## 2013-07-10 ENCOUNTER — Inpatient Hospital Stay (HOSPITAL_COMMUNITY)
Admission: AD | Admit: 2013-07-10 | Discharge: 2013-07-10 | Disposition: A | Discharge status: Home / Self Care | Payer: Medicaid Other | Source: Ambulatory Visit | Attending: Obstetrics & Gynecology | Admitting: Obstetrics & Gynecology

## 2013-07-10 ENCOUNTER — Encounter (HOSPITAL_COMMUNITY): Payer: Self-pay | Admitting: *Deleted

## 2013-07-10 DIAGNOSIS — Z3201 Encounter for pregnancy test, result positive: Secondary | ICD-10-CM

## 2013-07-10 DIAGNOSIS — Z3200 Encounter for pregnancy test, result unknown: Secondary | ICD-10-CM | POA: Insufficient documentation

## 2013-07-10 LAB — POCT PREGNANCY, URINE: Preg Test, Ur: POSITIVE — AB

## 2013-07-10 NOTE — MAU Note (Signed)
Woke up this morning feeling nauseate.  Bought a preg test when she got off work, it was positive and she hasn't come on her cycle yet

## 2013-07-10 NOTE — Discharge Instructions (Signed)
Prenatal Care  °WHAT IS PRENATAL CARE?  °Prenatal care means health care during your pregnancy, before your baby is born. It is very important to take care of yourself and your baby during your pregnancy by:  °· Getting early prenatal care. If you know you are pregnant, or think you might be pregnant, call your health care provider as soon as possible. Schedule a visit for a prenatal exam. °· Getting regular prenatal care. Follow your health care provider's schedule for blood and other necessary tests. Do not miss appointments. °· Doing everything you can to keep yourself and your baby healthy during your pregnancy. °· Getting complete care. Prenatal care should include evaluation of the medical, dietary, educational, psychological, and social needs of you and your significant other. The medical and genetic history of your family and the family of your baby's father should be discussed with your health care provider. °· Discussing with your health care provider: °· Prescription, over-the-counter, and herbal medicines that you take. °· Any history of substance abuse, alcohol use, smoking, and illegal drug use. °· Any history of domestic abuse and violence. °· Immunizations you have received. °· Your nutrition and diet. °· The amount of exercise you do. °· Any environmental and occupational hazards to which you are exposed. °· History of sexually transmitted infections for both you and your partner. °· Previous pregnancies you have had. °WHY IS PRENATAL CARE SO IMPORTANT?  °By regularly seeing your health care provider, you help ensure that problems can be identified early so that they can be treated as soon as possible. Other problems might be prevented. Many studies have shown that early and regular prenatal care is important for the health of mothers and their babies.  °HOW CAN I TAKE CARE OF MYSELF WHILE I AM PREGNANT?  °Here are ways to take care of yourself and your baby:  °· Start or continue taking your  multivitamin with 400 micrograms (mcg) of folic acid every day. °· Get early and regular prenatal care. It is very important to see a health care provider during your pregnancy. Your health care provider will check at each visit to make sure that you and the baby are healthy. If there are any problems, action can be taken right away to help you and the baby. °· Eat a healthy diet that includes: °· Fruits. °· Vegetables. °· Foods low in saturated fat. °· Whole grains. °· Calcium-rich foods, such as milk, yogurt, and hard cheeses. °· Drink 6 to 8 glasses of liquids a day. °· Unless your health care provider tells you not to, try to be physically active for 30 minutes, most days of the week. If you are pressed for time, you can get your activity in through 10-minute segments, three times a day. °· Do not smoke, drink alcohol, or use drugs. These can cause long-term damage to your baby. Talk with your health care provider about steps to take to stop smoking. Talk with a member of your faith community, a counselor, a trusted friend, or your health care provider if you are concerned about your alcohol or drug use. °· Ask your health care provider before taking any medicine, even over-the-counter medicines. Some medicines are not safe to take during pregnancy. °· Get plenty of rest and sleep. °· Avoid hot tubs and saunas during pregnancy. °· Do not have X-rays taken unless absolutely necessary and with the recommendation of your health care provider. A lead shield can be placed on your abdomen to protect the   baby when X-rays are taken in other parts of the body. °· Do not empty the cat litter when you are pregnant. It may contain a parasite that causes an infection called toxoplasmosis, which can cause birth defects. Also, use gloves when working in garden areas used by cats. °· Do not eat uncooked or undercooked meats or fish. °· Do not eat soft, mold-ripened cheeses (Brie, Camembert, and chevre) or soft, blue-veined  cheese (Danish blue and Roquefort). °· Stay away from toxic chemicals like: °· Insecticides. °· Solvents (some cleaners or paint thinners). °· Lead. °· Mercury. °· Sexual intercourse may continue until the end of the pregnancy, unless you have a medical problem or there is a problem with the pregnancy and your health care provider tells you not to. °· Do not wear high-heel shoes, especially during the second half of the pregnancy. You can lose your balance and fall. °· Do not take long trips, unless absolutely necessary. Be sure to see your health care provider before going on the trip. °· Do not sit in one position for more than 2 hours when on a trip. °· Take a copy of your medical records when going on a trip. Know where a hospital is located in the city you are visiting, in case of an emergency. °· Most dangerous household products will have pregnancy warnings on their labels. Ask your health care provider about products if you are unsure. °· Limit or eliminate your caffeine intake from coffee, tea, sodas, medicines, and chocolate. °· Many women continue working through pregnancy. Staying active might help you stay healthier. If you have a question about the safety or the hours you work at your particular job, talk with your health care provider. °· Get informed: °· Read books. °· Watch videos. °· Go to childbirth classes for you and your significant other. °· Talk with experienced moms. °· Ask your health care provider about childbirth education classes for you and your partner. Classes can help you and your partner prepare for the birth of your baby. °· Ask about a baby doctor (pediatrician) and methods and pain medicine for labor, delivery, and possible cesarean delivery. °HOW OFTEN SHOULD I SEE MY HEALTH CARE PROVIDER DURING PREGNANCY?  °Your health care provider will give you a schedule for your prenatal visits. You will have visits more often as you get closer to the end of your pregnancy. An average  pregnancy lasts about 40 weeks.  °A typical schedule includes visiting your health care provider:  °· About once each month during your first 6 months of pregnancy. °· Every 2 weeks during the next 2 months. °· Weekly in the last month, until the delivery date. °Your health care provider will probably want to see you more often if: °· You are older than 35 years. °· Your pregnancy is high risk because you have certain health problems or problems with the pregnancy, such as: °· Diabetes. °· High blood pressure. °· The baby is not growing on schedule, according to the dates of the pregnancy. °Your health care provider will do special tests to make sure you and the baby are not having any serious problems. °WHAT HAPPENS DURING PRENATAL VISITS?  °· At your first prenatal visit, your health care provider will do a physical exam and talk to you about your health history and the health history of your partner and your family. Your health care provider will be able to tell you what date to expect your baby to be born on. °·   Your first physical exam will include checks of your blood pressure, measurements of your height and weight, and an exam of your pelvic organs. Your health care provider will do a Pap test if you have not had one recently and will do cultures of your cervix to make sure there is no infection.  At each prenatal visit, there will be tests of your blood, urine, blood pressure, weight, and checking the progress of the baby.  At your later prenatal visits, your health care provider will check how you are doing and how the baby is developing. You may have a number of tests done as your pregnancy progresses.  Ultrasound exams are often used to check on the baby's growth and health.  You may have more urine and blood tests, as well as special tests, if needed. These may include amniocentesis to examine fluid in the pregnancy sac, stress tests to check how the baby responds to contractions, or a  biophysical profile to measure fetus well-being. Your health care provider will explain the tests and why they are necessary.  You should discuss with your health care provider your plans to breastfeed or bottle-feed your baby.  Each visit is also a chance for you to learn about staying healthy during pregnancy and to ask questions. Document Released: 06/03/2003 Document Revised: 03/21/2013 Document Reviewed: 11/16/2012 [facility] Patient Information 2014 [place], [place].  Pregnancy, The Father's Role A father has an important role during their partners pregnancy, labor, delivery and afterward. It is important to help and support your partner through this new period. There are many physical and emotional changes that happen. To be helpful and supportive during this time, you should know and understand what is happening to your partner during the pregnancy, labor, delivery and postpartum period.  PREGNANCY Pregnancy lasts 40 weeks (plus or minus 2 weeks). The pregnancy is divided into three trimesters.   In the first 13 weeks, the mother feels tired, has painful breasts, may feel sick to her stomach (nauseated), throw up (vomit), urinates more often and may have mood changes. All of these changes are normal. If the father is aware of these, he can be more helpful, supportive and understanding. This may include helping with household duties and activities and spending more time with each other.  In the next 14 to 28 weeks, your partner is over the tiredness, nausea and vomiting. She will likely feel better and more energetic. This is the best time of the pregnancy to be more active together, go out more often or take trips. You will be able to see her belly popping out with the pregnancy. You may be able to feel the baby kick.  In the last 12 weeks, she may become more uncomfortable again because her abdomen is popping out more as the baby grows. She may have a hard time doing household chores, her  balance may be off, she may have a hard time bending over, tires easily and has a tough time sleeping. At this time, you will realize the birth of your baby is close. You and your partner may have concerns about the safety of your partner and if the baby will be normal and healthy. These are all normal and natural feelings. You should talk with each other and your caregiver if you have any questions. Attend prenatal care visits with your partner. This is a good time for you to get to know your caregiver, follow the pregnancy and ask questions. Prenatal visits are once a month  for 6 months, then they are every 2 weeks for 2 months and then once a week the last month. You may have more prenatal visits if your caregiver feels it is needed. Your caregiver usually does an ultrasound of the baby at one of the prenatal visits or more often if needed. It is an exciting and emotional to see the baby moving and the heart beating.  Fathers can experience emotional changes during this time as well. These emotions can include happiness, excitement and feeling proud. Fathers may also be concerned about having new responsibilities. These include financial, educational and if it will change the relationship with his partner. These feelings are normal. They should be talked about openly and positively with each other. An important and often asked question is if sexual intercourse is safe during pregnancy and if it will harm the baby. Sexual intercourse is safe unless there is a problem with the pregnancy and your caregiver advises you to not have sexual intercourse. Because physical and emotional changes happen in pregnancy, your partner may not want to have sex during certain times. This is mostly true in the first and third trimesters. Trying different positions may make sexual intercourse comfortable. It is important for the both of you to discuss your feelings and desires with this problem. Talk to your caregiver about any  questions you have about sexual intercourse during the pregnancy. LABOR AND DELIVERY There are childbirth classes available for couples to take together. They help you understand what happens during labor and delivery. They also teach you how to help your partner with her labor pains, how to relax, breath properly during a contraction and focus on what is happening during labor. You may be asked to time the contractions, massage her back and breath with her during the contractions. You are also there to see and enjoy the excitement your baby being born. If you have any feelings of fainting or are uncomfortable, tell someone to help you. You may be asked to leave the room if a problem develops during the labor or delivery. Sometimes a Cesarean Section (C-section) is scheduled or is an emergency during labor and delivery. A C-section is a major operation to deliver the baby. It is done through an incision in the abdomen and uterus. Your partner will be given a medicine to make her sleep (general anesthesia) or spinal anesthesia (numbing the body from the waist down). Most hospitals allow the father in the room for a C-section unless it is an emergency. Recovery from a C-section takes longer, is more uncomfortable and will require more help from the father. AFTER DELIVERY After the baby is born, the mother goes through many changes again. These changes could last 4 to 6 weeks or longer following a C-section. It is not unusual to be anxious, concerned and afraid that you may not be taking care of your newborn baby properly. Your partner may take a while to regain her strength. She may also get feelings of sadness (postpartum blues or depression), which is a more serious condition that may require medical treatment.  Your partner may decide to breastfeed the baby. This helps with bonding between the mother and the baby. Breastfeeding is the best way to feed the baby, but you may feel "left out." However, you can  feel included by burping the baby and bottle feeding the baby with breast milk (collected by the mother) to give your partner some rest. This also helps you to bond with the baby. Breastfeeding  mothers can get pregnant even if they are not having menstrual periods. Therefore, some form of birth control should be used if you do not want to get pregnant. Another question and concern is when it is safe to have sexual intercourse again. Usually it takes 4 to 6 weeks for healing to be over with. It may take longer after a C-section. If you have any questions about having sexual intercourse or if it is painful, talk to your caregiver. As a father, you will be adjusting your role as the baby grows. Fatherhood is a on-going Barrister's clerklearning experience. You and your partner should still make time to be together alone and be the couple you were before the baby was born. This is helpful for you, your partner and your baby. As you can see, it is important for a father to be helpful, understanding and supportive during this special time. Document Released: 11/17/2007 Document Revised: 08/23/2011 Document Reviewed: 11/17/2007 [facility] Patient Information 2014 [place]ExitCare, [place]LL[place].

## 2013-07-10 NOTE — [facility] Provider Note (Signed)
HPI:  [name] is a 23 y.o. female G2P0 at 5584w6d who presents to [facility] for a pregnancy verification letter. She had a positive pregnancy test today. She currently denies pain or bleeding at this time. This is not a desired pregnancy   Objective:  GENERAL: Well-developed, well-nourished female in no acute distress.  HEENT: Normocephalic, atraumatic.   LUNGS: Effort normal HEART: Regular rate  SKIN: Warm, dry and without erythema PSYCH: Normal mood and affect  Filed Vitals:   07/10/13 1846  BP: 123/68  Pulse: 87  Temp: 98.5 F (36.9 C)  Resp: 18    Results for orders placed during the hospital encounter of 07/10/13 (from the past 48 hour(s))  POCT PREGNANCY, URINE     Status: Abnormal   Collection Time    07/10/13  7:07 PM      Result Value Range   Preg Test, Ur POSITIVE (*) NEGATIVE   Comment:            THE SENSITIVITY OF THIS     METHODOLOGY IS >24 mIU/mL    Assessment:  1. Positive pregnancy test      Plan:  Discharge home in stable condition  Pregnancy verification letter given to the patient  Start prenatal care First trimester warning signs given.    Valerie HansenJennifer Irene Nicasio Barlowe, NP 07/10/2013 7:25 PM

## 2013-07-12 NOTE — [facility] Provider Note (Signed)
Attestation of Attending Supervision of Advanced Practitioner (PA/CNM/NP): Evaluation and management procedures were performed by the Advanced Practitioner under my supervision and collaboration.  I have reviewed the Advanced Practitioner's note and chart, and I agree with the management and plan.  Walida Cajas, MD, FACOG Attending Obstetrician & Gynecologist Faculty Practice, [facility]  

## 2013-09-03 ENCOUNTER — Emergency Department (HOSPITAL_COMMUNITY)
Admission: EM | Admit: 2013-09-03 | Discharge: 2013-09-04 | Disposition: A | Discharge status: Home / Self Care | Payer: Self-pay | Attending: Emergency Medicine | Admitting: Emergency Medicine

## 2013-09-03 DIAGNOSIS — J020 Streptococcal pharyngitis: Secondary | ICD-10-CM | POA: Insufficient documentation

## 2013-09-03 DIAGNOSIS — Z8669 Personal history of other diseases of the nervous system and sense organs: Secondary | ICD-10-CM | POA: Insufficient documentation

## 2013-09-03 DIAGNOSIS — M549 Dorsalgia, unspecified: Secondary | ICD-10-CM | POA: Insufficient documentation

## 2013-09-03 NOTE — ED Notes (Signed)
Pt states that she started having fever and body aches today. Denies respiratory sx. States fever was 101.1 at home.

## 2013-09-04 ENCOUNTER — Encounter (HOSPITAL_COMMUNITY): Payer: Self-pay | Admitting: Emergency Medicine

## 2013-09-04 LAB — RAPID STREP SCREEN (MED CTR MEBANE ONLY): Streptococcus, Group A Screen (Direct): POSITIVE — AB

## 2013-09-04 MED ORDER — DIPHENHYDRAMINE HCL 12.5 MG/5ML PO ELIX
25.0000 mg | ORAL_SOLUTION | Freq: Once | ORAL | Status: AC
  Administered 2013-09-04: 25 mg via ORAL
  Filled 2013-09-04: qty 10

## 2013-09-04 MED ORDER — AMOXICILLIN 500 MG PO CAPS: 500.0000 mg | ORAL_CAPSULE | Freq: Three times a day (TID) | ORAL | Status: DC

## 2013-09-04 MED ORDER — IBUPROFEN 800 MG PO TABS
800.0000 mg | ORAL_TABLET | Freq: Once | ORAL | Status: AC
  Administered 2013-09-04: 800 mg via ORAL
  Filled 2013-09-04: qty 1

## 2013-09-04 NOTE — ED Provider Notes (Signed)
Medical screening examination/treatment/procedure(s) were performed by non-physician practitioner and as supervising physician I was immediately available for consultation/collaboration.   EKG Interpretation None        Julie Manly, MD 09/04/13 2357 

## 2013-09-04 NOTE — Discharge Instructions (Signed)
You were found to have a positive strep throat test. Please take antibiotics as prescribed for the full length of time. Followup with a primary care provider for continued evaluation and treatment.    Strep Throat Strep throat is an infection of the throat caused by a bacteria named Streptococcus pyogenes. Your caregiver may call the infection streptococcal "tonsillitis" or "pharyngitis" depending on whether there are signs of inflammation in the tonsils or back of the throat. Strep throat is most common in children aged 5 15 years during the cold months of the year, but it can occur in people of any age during any season. This infection is spread from person to person (contagious) through coughing, sneezing, or other close contact. SYMPTOMS   Fever or chills.  Painful, swollen, red tonsils or throat.  Pain or difficulty when swallowing.  White or yellow spots on the tonsils or throat.  Swollen, tender lymph nodes or "glands" of the neck or under the jaw.  Red rash all over the body (rare). DIAGNOSIS  Many different infections can cause the same symptoms. A test must be done to confirm the diagnosis so the right treatment can be given. A "rapid strep test" can help your caregiver make the diagnosis in a few minutes. If this test is not available, a light swab of the infected area can be used for a throat culture test. If a throat culture test is done, results are usually available in a day or two. TREATMENT  Strep throat is treated with antibiotic medicine. HOME CARE INSTRUCTIONS   Gargle with 1 tsp of salt in 1 cup of warm water, 3 4 times per day or as needed for comfort.  Family members who also have a sore throat or fever should be tested for strep throat and treated with antibiotics if they have the strep infection.  Make sure everyone in your household washes their hands well.  Do not share food, drinking cups, or personal items that could cause the infection to spread to  others.  You may need to eat a soft food diet until your sore throat gets better.  Drink enough water and fluids to keep your urine clear or pale yellow. This will help prevent dehydration.  Get plenty of rest.  Stay home from school, daycare, or work until you have been on antibiotics for 24 hours.  Only take over-the-counter or prescription medicines for pain, discomfort, or fever as directed by your caregiver.  If antibiotics are prescribed, take them as directed. Finish them even if you start to feel better. SEEK MEDICAL CARE IF:   The glands in your neck continue to enlarge.  You develop a rash, cough, or earache.  You cough up green, yellow-brown, or bloody sputum.  You have pain or discomfort not controlled by medicines.  Your problems seem to be getting worse rather than better. SEEK IMMEDIATE MEDICAL CARE IF:   You develop any new symptoms such as vomiting, severe headache, stiff or painful neck, chest pain, shortness of breath, or trouble swallowing.  You develop severe throat pain, drooling, or changes in your voice.  You develop swelling of the neck, or the skin on the neck becomes red and tender.  You have a fever.  You develop signs of dehydration, such as fatigue, dry mouth, and decreased urination.  You become increasingly sleepy, or you cannot wake up completely. Document Released: 05/28/2000 Document Revised: 05/17/2012 Document Reviewed: 07/30/2010 [facility] LLCExitCare Patient Information 2014 [place]ExitCare, [place]LL[place].

## 2013-09-04 NOTE — ED Provider Notes (Signed)
CSN: [id number]     Arrival date & time 09/03/13  2252 History   First MD Initiated Contact with Patient 09/04/13 0043     Chief Complaint  Patient presents with  . Fever  . Generalized Body Aches   HPI  History provided by the patient. Patient is a 23 year old female with no significant PMH who presents with symptoms of fever, body aches and sore throat. Patient reports waking early this morning with sore throat and body aches. She reports off and on fevers and chills throughout the day. She also had a headache earlier which she took Excedrin for and now improved. She denies any improvement of her fever, body aches or sore throat symptoms. States her fever was as high as 101 at home. Denies any associated cough, congestion or rhinorrhea. No vomiting or diarrhea symptoms. Denies any known sick contacts. No recent travel. No other aggravating or alleviating factors. No other associated symptoms.     Past Medical History  Diagnosis Date  . Bell palsy    History reviewed. No pertinent past surgical history. History reviewed. No pertinent family history. History  Substance Use Topics  . Smoking status: Never Smoker   . Smokeless tobacco: Not on file  . Alcohol Use: Yes   OB History   Grav Para Term Preterm Abortions TAB SAB Ect Mult Living   1              Review of Systems  Constitutional: Positive for fever, chills, appetite change and fatigue.  HENT: Positive for sore throat. Negative for congestion and rhinorrhea.   Respiratory: Negative for cough.   Gastrointestinal: Negative for vomiting, abdominal pain and diarrhea.  Genitourinary: Negative for dysuria, frequency, hematuria, vaginal bleeding and vaginal discharge.  Musculoskeletal: Positive for back pain.  Skin: Negative for rash.  All other systems reviewed and are negative.      Allergies  Review of patient's allergies indicates no known allergies.  Home Medications   Current Outpatient Rx  Name  Route  Sig   Dispense  Refill  . ibuprofen (ADVIL,MOTRIN) 200 MG tablet   Oral   Take 200 mg by mouth every 6 (six) hours as needed for mild pain.          BP 124/75  Pulse 102  Temp(Src) 98.6 F (37 C) (Oral)  Resp 20  SpO2 98%  LMP 05/26/2013 Physical Exam  Nursing note and vitals reviewed. Constitutional: She is oriented to person, place, and time. She appears well-developed and well-nourished. No distress.  HENT:  Head: Normocephalic and atraumatic.  Right Ear: External ear normal.  Left Ear: External ear normal.  Tonsils enlarged bilaterally with erythema and white exudate. Uvula midline. No signs of PTA. No other sores or lesions within the mouth or oropharynx. No petechiae.  Eyes: Conjunctivae are normal.  Neck: Normal range of motion. Neck supple.  Cardiovascular: Normal rate and regular rhythm.   Pulmonary/Chest: Effort normal and breath sounds normal. No stridor. No respiratory distress. She has no wheezes. She has no rales.  Abdominal: Soft. There is no tenderness. There is no rebound and no guarding.  Musculoskeletal: Normal range of motion.  Lymphadenopathy:    She has cervical adenopathy.  Neurological: She is alert and oriented to person, place, and time.  Skin: Skin is warm and dry. No rash noted.  Psychiatric: She has a normal mood and affect. Her behavior is normal.    ED Course  Procedures   DIAGNOSTIC STUDIES: Oxygen Saturation is 98% on  room air.  COORDINATION OF CARE:  Nursing notes reviewed. Vital signs reviewed. Initial pt interview and examination performed.   12:46 AM-patient seen and evaluated. She appears well in no acute distress. Does not appear severely ill or toxic. Discussed work up plan with pt at bedside, which includes strep to. Pt agrees with plan.   Results for orders placed during the hospital encounter of 09/03/13  RAPID STREP SCREEN      Result Value Ref Range   Streptococcus, Group A Screen (Direct) POSITIVE (*) NEGATIVE          MDM   Final diagnoses:  Strep throat        Angus Sellereter S Ashleynicole Mcclees, PA-C 09/04/13 2102

## 2014-02-15 ENCOUNTER — Encounter (HOSPITAL_COMMUNITY): Payer: Self-pay | Admitting: Emergency Medicine

## 2014-02-15 ENCOUNTER — Emergency Department (HOSPITAL_COMMUNITY)
Admission: EM | Admit: 2014-02-15 | Discharge: 2014-02-15 | Discharge status: Against Medical Advice | Payer: Self-pay | Attending: Emergency Medicine | Admitting: Emergency Medicine

## 2014-02-15 DIAGNOSIS — R209 Unspecified disturbances of skin sensation: Secondary | ICD-10-CM | POA: Insufficient documentation

## 2014-02-15 NOTE — ED Notes (Signed)
The pt has a lt facial numbness since yesterday.  She has had bells palsy dx in 2010 and she has been treated with prednisone x 2 since then for the bells.  Since yesterday she has lt facial numbness and aching  Where it has been in the past.  lmp  Last month

## 2014-04-15 ENCOUNTER — Encounter (HOSPITAL_COMMUNITY): Payer: Self-pay | Admitting: Emergency Medicine

## 2015-08-06 ENCOUNTER — Inpatient Hospital Stay (HOSPITAL_COMMUNITY)
Admission: AD | Admit: 2015-08-06 | Discharge: 2015-08-06 | Disposition: A | Discharge status: Home / Self Care | Payer: BLUE CROSS/BLUE SHIELD | Source: Ambulatory Visit | Attending: Obstetrics and Gynecology | Admitting: Obstetrics and Gynecology

## 2015-08-06 DIAGNOSIS — N390 Urinary tract infection, site not specified: Secondary | ICD-10-CM

## 2015-08-06 DIAGNOSIS — R300 Dysuria: Secondary | ICD-10-CM | POA: Diagnosis present

## 2015-08-06 DIAGNOSIS — R319 Hematuria, unspecified: Secondary | ICD-10-CM | POA: Diagnosis present

## 2015-08-06 LAB — URINALYSIS, ROUTINE W REFLEX MICROSCOPIC
Bilirubin Urine: NEGATIVE
GLUCOSE, UA: NEGATIVE mg/dL
Ketones, ur: NEGATIVE mg/dL
Nitrite: POSITIVE — AB
PH: 6.5 (ref 5.0–8.0)
PROTEIN: 30 mg/dL — AB
SPECIFIC GRAVITY, URINE: 1.02 (ref 1.005–1.030)

## 2015-08-06 LAB — URINE MICROSCOPIC-ADD ON

## 2015-08-06 LAB — POCT PREGNANCY, URINE: Preg Test, Ur: NEGATIVE

## 2015-08-06 MED ORDER — NITROFURANTOIN MONOHYD MACRO 100 MG PO CAPS: 100.0000 mg | ORAL_CAPSULE | Freq: Two times a day (BID) | ORAL | Status: DC

## 2015-08-06 NOTE — MAU Note (Signed)
Pt c/o seeing blood in urine for a few days. Denies pain with urination or burning, but c/o odor and increase in frequency. LMP: beginning of this month.

## 2015-08-06 NOTE — MAU Provider Note (Signed)
History     CSN: [id number]  Arrival date and time: 08/06/15 2952   First Provider Initiated Contact with Patient 08/06/15 2003      Chief Complaint  Patient presents with  . Hematuria   Dysuria  This is a new problem. The current episode started 1 to 4 weeks ago. The problem occurs every urination. The problem has been unchanged. The pain is at a severity of 2/10. The pain is mild. There has been no fever. She is sexually active. There is no history of pyelonephritis. Associated symptoms include frequency, hematuria and urgency. Pertinent negatives include no chills, nausea or vomiting. She has tried nothing for the symptoms.  Hematuria This is a new problem. The current episode started 1 to 4 weeks ago. She describes the hematuria as gross hematuria. The hematuria occurs throughout her entire urinary stream. She reports no clotting in her urine stream. Her pain is at a severity of 2/10. The pain is mild. Irritative symptoms include frequency and urgency. Associated symptoms include abdominal pain and dysuria. Pertinent negatives include no chills, fever, nausea or vomiting.    Past Medical History  Diagnosis Date  . Bell palsy     No past surgical history on file.  No family history on file.  Social History  Substance Use Topics  . Smoking status: Never Smoker   . Smokeless tobacco: Not on file  . Alcohol Use: Yes    Allergies: No Known Allergies  Prescriptions prior to admission  Medication Sig Dispense Refill Last Dose  . amoxicillin (AMOXIL) 500 MG capsule Take 1 capsule (500 mg total) by mouth 3 (three) times daily. 30 capsule 0   . ibuprofen (ADVIL,MOTRIN) 200 MG tablet Take 200 mg by mouth every 6 (six) hours as needed for mild pain.   09/03/2013 at Unknown time    Review of Systems  Constitutional: Negative for fever and chills.  Gastrointestinal: Positive for abdominal pain. Negative for nausea, vomiting, diarrhea and constipation.  Genitourinary: Positive for  dysuria, urgency, frequency and hematuria.   Physical Exam   Blood pressure 143/91, pulse 87, temperature 98.4 F (36.9 C), temperature source Oral, resp. rate 18, height  (1.651 m), weight 116.847 kg (257 lb 9.6 oz), SpO2 100 %, unknown if currently breastfeeding.  Physical Exam  Nursing note and vitals reviewed. Constitutional: She is oriented to person, place, and time. She appears well-developed and well-nourished. No distress.  HENT:  Head: Normocephalic.  Cardiovascular: Normal rate.   Respiratory: Effort normal.  GI: Soft. There is no tenderness. There is no rebound.  Musculoskeletal: Normal range of motion.  Neurological: She is alert and oriented to person, place, and time.  Skin: Skin is warm and dry.  Psychiatric: She has a normal mood and affect.     Results for orders placed or performed during the hospital encounter of 08/06/15 (from the past 24 hour(s))  Urinalysis, Routine w reflex microscopic (not at [facility])     Status: Abnormal   Collection Time: 08/06/15  7:17 PM  Result Value Ref Range   Color, Urine YELLOW YELLOW   APPearance CLOUDY (A) CLEAR   Specific Gravity, Urine 1.020 1.005 - 1.030   pH 6.5 5.0 - 8.0   Glucose, UA NEGATIVE NEGATIVE mg/dL   Hgb urine dipstick LARGE (A) NEGATIVE   Bilirubin Urine NEGATIVE NEGATIVE   Ketones, ur NEGATIVE NEGATIVE mg/dL   Protein, ur 30 (A) NEGATIVE mg/dL   Nitrite POSITIVE (A) NEGATIVE   Leukocytes, UA SMALL (A) NEGATIVE  Urine microscopic-add on     Status: Abnormal   Collection Time: 08/06/15  7:17 PM  Result Value Ref Range   Squamous Epithelial / LPF 6-30 (A) NONE SEEN   WBC, UA 0-5 0 - 5 WBC/hpf   RBC / HPF TOO NUMEROUS TO COUNT 0 - 5 RBC/hpf   Bacteria, UA MANY (A) NONE SEEN  Pregnancy, urine POC     Status: None   Collection Time: 08/06/15  7:30 PM  Result Value Ref Range   Preg Test, Ur NEGATIVE NEGATIVE    MAU Course  Procedures  MDM   Assessment and Plan   1. UTI (lower urinary tract  infection)    DC home Comfort measures reviewed  RX: macrobid BID #10  Return to MAU as needed FU with OB as planned  Follow-up Information    Follow up with [facility].   Why:  If symptoms worsen   Contact information:   [address] [place] [address] [phone]         Tawnya Crook 08/06/2015, 8:04 PM

## 2015-08-06 NOTE — Discharge Instructions (Signed)

## 2015-12-18 ENCOUNTER — Encounter: Payer: Self-pay | Admitting: Podiatry

## 2015-12-18 ENCOUNTER — Ambulatory Visit (INDEPENDENT_AMBULATORY_CARE_PROVIDER_SITE_OTHER): Payer: BLUE CROSS/BLUE SHIELD | Admitting: Podiatry

## 2015-12-18 VITALS — BP 121/80 | HR 82 | Resp 16 | Ht 67.0 in

## 2015-12-18 DIAGNOSIS — L259 Unspecified contact dermatitis, unspecified cause: Secondary | ICD-10-CM

## 2015-12-18 DIAGNOSIS — B372 Candidiasis of skin and nail: Secondary | ICD-10-CM | POA: Diagnosis not present

## 2015-12-18 MED ORDER — TERBINAFINE HCL 250 MG PO TABS: 250.0000 mg | ORAL_TABLET | Freq: Every day | ORAL | Status: DC

## 2015-12-18 NOTE — Progress Notes (Signed)
   Subjective:    Patient ID: Valerie Hancock, female    DOB: 02/07/1991, 25 y.o.   MRN: [id number]  HPI Chief Complaint  Patient presents with  . Skin Condition    Bilateral; pt stated, "skin peels on both feet"; x1 yr  . Callouses    Right foot; great toe-medial; plantar forefoot-below 5th toe; x1 yr  . Nail Problem    Bilateral; 5th toes; nail discoloration & thickened nails; x1 yr      Review of Systems  All other systems reviewed and are negative.      Objective:   Physical Exam        Assessment & Plan:

## 2016-01-03 NOTE — Progress Notes (Signed)
Subjective:     Patient ID: Valerie Hancock, female   DOB: 1990/11/13, 25 y.o.   MRN: [id number]  HPI patient presents stating she's had a lot of thickness calluses and nail conditions with irritation of tissue and a history of blistering of the feet with thickness of the nailbeds noted   Review of Systems  All other systems reviewed and are negative.      Objective:   Physical Exam  Constitutional: She is oriented to person, place, and time.  Cardiovascular: Intact distal pulses.   Musculoskeletal: Normal range of motion.  Neurological: She is oriented to person, place, and time.  Skin: Skin is warm.  Nursing note and vitals reviewed.  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

## 2016-02-04 ENCOUNTER — Encounter: Payer: Self-pay | Admitting: Podiatry

## 2016-02-04 ENCOUNTER — Ambulatory Visit (INDEPENDENT_AMBULATORY_CARE_PROVIDER_SITE_OTHER): Payer: BLUE CROSS/BLUE SHIELD | Admitting: Podiatry

## 2016-02-04 VITALS — BP 121/83 | HR 88 | Resp 16

## 2016-02-04 DIAGNOSIS — L259 Unspecified contact dermatitis, unspecified cause: Secondary | ICD-10-CM | POA: Diagnosis not present

## 2016-02-04 DIAGNOSIS — B372 Candidiasis of skin and nail: Secondary | ICD-10-CM | POA: Diagnosis not present

## 2016-02-04 NOTE — Progress Notes (Signed)
Subjective:     Patient ID: Valerie Hancock, female   DOB: 01/13/1991, 25 y.o.   MRN: [id number]  HPI patient presents stating I got some discoloration on top of my feet and while my feet are definitely improving underneath these continue to be at times and I was just concerned could be a reaction. Been taking my antifungal for 2 weeks   Review of Systems     Objective:   Physical Exam Neurovascular status intact muscle strength adequate with small areas of irritation dorsal left that it's hard to say whether they've been there before or whether there a new manifestation of fungal infection    Assessment:     Advised patient this does not appear to be a systemic reaction that she's had no changes anywhere else and at this point she will start cortisone cream on this continue to monitor and if any issues were to occur let us know    Plan:     Possibility for dermatitis or fungal infiltration

## 2016-02-09 ENCOUNTER — Encounter (HOSPITAL_COMMUNITY): Payer: Self-pay | Admitting: *Deleted

## 2016-02-09 ENCOUNTER — Ambulatory Visit (HOSPITAL_COMMUNITY)
Admission: EM | Admit: 2016-02-09 | Discharge: 2016-02-09 | Disposition: A | Discharge status: Home / Self Care | Payer: BLUE CROSS/BLUE SHIELD | Attending: Family Medicine | Admitting: Family Medicine

## 2016-02-09 DIAGNOSIS — N390 Urinary tract infection, site not specified: Secondary | ICD-10-CM | POA: Diagnosis not present

## 2016-02-09 LAB — POCT URINALYSIS DIP (DEVICE)
GLUCOSE, UA: NEGATIVE mg/dL
NITRITE: NEGATIVE
PH: 7 (ref 5.0–8.0)
Protein, ur: >=300 mg/dL — AB
Specific Gravity, Urine: 1.025 (ref 1.005–1.030)
Urobilinogen, UA: 1 mg/dL (ref 0.0–1.0)

## 2016-02-09 LAB — POCT PREGNANCY, URINE: PREG TEST UR: NEGATIVE

## 2016-02-09 MED ORDER — PHENAZOPYRIDINE HCL 200 MG PO TABS: 200.0000 mg | ORAL_TABLET | Freq: Three times a day (TID) | ORAL | 0 refills | Status: DC

## 2016-02-09 MED ORDER — CIPROFLOXACIN HCL 500 MG PO TABS: 500.0000 mg | ORAL_TABLET | Freq: Two times a day (BID) | ORAL | 0 refills | Status: DC

## 2016-02-09 NOTE — ED Provider Notes (Signed)
CSN: [id number]     Arrival date & time 02/09/16  4098 History   First MD Initiated Contact with Patient 02/09/16 1019     Chief Complaint  Patient presents with  . Urinary Tract Infection   (Consider location/radiation/quality/duration/timing/severity/associated sxs/prior Treatment) Patient c/o dysuria and urinary frequency    Urinary Frequency  This is a new problem. The problem occurs constantly. The problem has not changed since onset.Nothing aggravates the symptoms. Nothing relieves the symptoms. She has tried nothing for the symptoms.    Past Medical History:  Diagnosis Date  . Bell palsy    History reviewed. No pertinent surgical history. History reviewed. No pertinent family history. Social History  Substance Use Topics  . Smoking status: Never Smoker  . Smokeless tobacco: Not on file  . Alcohol use Yes   OB History    Gravida Para Term Preterm AB Living   1             SAB TAB Ectopic Multiple Live Births                 Review of Systems  Constitutional: Negative.   HENT: Negative.   Eyes: Negative.   Respiratory: Negative.   Cardiovascular: Negative.   Gastrointestinal: Negative.   Endocrine: Negative.   Genitourinary: Positive for frequency.  Musculoskeletal: Negative.   Skin: Negative.   Allergic/Immunologic: Negative.   Neurological: Negative.   Hematological: Negative.   Psychiatric/Behavioral: Negative.     Allergies  Review of patient's allergies indicates no known allergies.  Home Medications   Prior to Admission medications   Medication Sig Start Date End Date Taking? Authorizing Provider  ciprofloxacin (CIPRO) 500 MG tablet Take 1 tablet (500 mg total) by mouth 2 (two) times daily. 02/09/16   Deatra Canter, FNP  ibuprofen (ADVIL,MOTRIN) 200 MG tablet Take 200 mg by mouth every 6 (six) hours as needed for mild pain.    Historical Provider, MD  phenazopyridine (PYRIDIUM) 200 MG tablet Take 1 tablet (200 mg total) by mouth 3 (three)  times daily. 02/09/16   Deatra Canter, FNP  terbinafine (LAMISIL) 250 MG tablet Take 1 tablet (250 mg total) by mouth daily. 12/18/15   Lenn Sink, DPM   Meds Ordered and Administered this Visit  Medications - No data to display  BP 114/81 (BP Location: Left Arm)   Pulse 86   Temp 99.4 F (37.4 C) (Oral)   Resp 16   LMP 02/03/2016   SpO2 98%  No data found.   Physical Exam  Constitutional: She appears well-developed and well-nourished.  HENT:  Head: Normocephalic and atraumatic.  Right Ear: External ear normal.  Left Ear: External ear normal.  Mouth/Throat: Oropharynx is clear and moist.  Eyes: Conjunctivae and EOM are normal. Pupils are equal, round, and reactive to light.  Neck: Normal range of motion. Neck supple.  Cardiovascular: Normal rate, regular rhythm and normal heart sounds.   Pulmonary/Chest: Effort normal and breath sounds normal.  Abdominal: Soft.  Nursing note and vitals reviewed.   Urgent Care Course   Clinical Course    Procedures (including critical care time)  Labs Review Labs Reviewed  POCT URINALYSIS DIP (DEVICE) - Abnormal; Notable for the following:       Result Value   Bilirubin Urine SMALL (*)    Ketones, ur TRACE (*)    Hgb urine dipstick LARGE (*)    Protein, ur >=300 (*)    Leukocytes, UA SMALL (*)    All other  components within normal limits  POCT PREGNANCY, URINE    Imaging Review No results found.   Visual Acuity Review  Right Eye Distance:   Left Eye Distance:   Bilateral Distance:    Right Eye Near:   Left Eye Near:    Bilateral Near:         MDM   1. UTI (lower urinary tract infection)    cipro 500mg  one po bid x 7 days #14 Pyridium 200mg  one po tid x 2 days #6      Deatra CanterWilliam J Makaiah Terwilliger, FNP 02/09/16 1113

## 2016-02-09 NOTE — ED Triage Notes (Signed)
Pt  Reports   Symptoms    Of    Blood  In  Urine  X  2  Weeks     Foul  Odor      Pain  l  Side   With  Symptoms  X  sev  Weeks     Pt  Appears  In no  Acute  Severe  Distress

## 2016-03-30 ENCOUNTER — Emergency Department (HOSPITAL_COMMUNITY)
Admission: EM | Admit: 2016-03-30 | Discharge: 2016-03-30 | Disposition: A | Discharge status: Home / Self Care | Payer: BLUE CROSS/BLUE SHIELD | Attending: Emergency Medicine | Admitting: Emergency Medicine

## 2016-03-30 ENCOUNTER — Encounter (HOSPITAL_COMMUNITY): Payer: Self-pay | Admitting: Emergency Medicine

## 2016-03-30 DIAGNOSIS — R112 Nausea with vomiting, unspecified: Secondary | ICD-10-CM | POA: Diagnosis present

## 2016-03-30 LAB — COMPREHENSIVE METABOLIC PANEL
ALT: 10 U/L — ABNORMAL LOW (ref 14–54)
AST: 13 U/L — AB (ref 15–41)
Albumin: 3.5 g/dL (ref 3.5–5.0)
Alkaline Phosphatase: 60 U/L (ref 38–126)
Anion gap: 7 (ref 5–15)
BILIRUBIN TOTAL: 0.1 mg/dL — AB (ref 0.3–1.2)
BUN: 10 mg/dL (ref 6–20)
CHLORIDE: 108 mmol/L (ref 101–111)
CO2: 25 mmol/L (ref 22–32)
Calcium: 8.8 mg/dL — ABNORMAL LOW (ref 8.9–10.3)
Creatinine, Ser: 0.91 mg/dL (ref 0.44–1.00)
Glucose, Bld: 109 mg/dL — ABNORMAL HIGH (ref 65–99)
POTASSIUM: 4 mmol/L (ref 3.5–5.1)
Sodium: 140 mmol/L (ref 135–145)
TOTAL PROTEIN: 6.5 g/dL (ref 6.5–8.1)

## 2016-03-30 LAB — CBC WITH DIFFERENTIAL/PLATELET
Basophils Absolute: 0 10*3/uL (ref 0.0–0.1)
Basophils Relative: 0 %
EOS PCT: 1 %
Eosinophils Absolute: 0.1 10*3/uL (ref 0.0–0.7)
HEMATOCRIT: 35.6 % — AB (ref 36.0–46.0)
Hemoglobin: 11.5 g/dL — ABNORMAL LOW (ref 12.0–15.0)
LYMPHS ABS: 2.7 10*3/uL (ref 0.7–4.0)
LYMPHS PCT: 28 %
MCH: 27.7 pg (ref 26.0–34.0)
MCHC: 32.3 g/dL (ref 30.0–36.0)
MCV: 85.8 fL (ref 78.0–100.0)
MONO ABS: 0.6 10*3/uL (ref 0.1–1.0)
MONOS PCT: 6 %
NEUTROS ABS: 6.1 10*3/uL (ref 1.7–7.7)
Neutrophils Relative %: 65 %
PLATELETS: 259 10*3/uL (ref 150–400)
RBC: 4.15 MIL/uL (ref 3.87–5.11)
RDW: 14.2 % (ref 11.5–15.5)
WBC: 9.4 10*3/uL (ref 4.0–10.5)

## 2016-03-30 LAB — URINALYSIS, ROUTINE W REFLEX MICROSCOPIC
BILIRUBIN URINE: NEGATIVE
Glucose, UA: NEGATIVE mg/dL
HGB URINE DIPSTICK: NEGATIVE
KETONES UR: NEGATIVE mg/dL
Leukocytes, UA: NEGATIVE
Nitrite: NEGATIVE
PH: 6 (ref 5.0–8.0)
Protein, ur: NEGATIVE mg/dL
SPECIFIC GRAVITY, URINE: 1.024 (ref 1.005–1.030)

## 2016-03-30 LAB — I-STAT BETA HCG BLOOD, ED (MC, WL, AP ONLY)

## 2016-03-30 LAB — LIPASE, BLOOD: LIPASE: 23 U/L (ref 11–51)

## 2016-03-30 MED ORDER — ONDANSETRON 8 MG PO TBDP: 8.0000 mg | ORAL_TABLET | Freq: Three times a day (TID) | ORAL | 0 refills | Status: DC | PRN

## 2016-03-30 MED ORDER — SODIUM CHLORIDE 0.9 % IV BOLUS (SEPSIS)
1000.0000 mL | Freq: Once | INTRAVENOUS | Status: AC
  Administered 2016-03-30: 1000 mL via INTRAVENOUS

## 2016-03-30 MED ORDER — ONDANSETRON HCL 4 MG/2ML IJ SOLN
4.0000 mg | Freq: Once | INTRAMUSCULAR | Status: AC
  Administered 2016-03-30: 4 mg via INTRAVENOUS
  Filled 2016-03-30: qty 2

## 2016-03-30 NOTE — Discharge Instructions (Signed)
Drink plenty of fluids. Advance diet as tolerated. Zofran for nausea as prescribed as needed. Follow-up with family doctor. Return if worsening.

## 2016-03-30 NOTE — ED Notes (Signed)
Pt given water per PA's request

## 2016-03-30 NOTE — ED Notes (Signed)
Pt informed of the need of a urine sample. Pt stated she is unable to provide now and would like for ED staff to check back later.

## 2016-03-30 NOTE — ED Provider Notes (Signed)
[facility]-EMERGENCY DEPT Provider Note   CSN: [id number] Arrival date & time: 03/30/16  0547     History   Chief Complaint Chief Complaint  Patient presents with  . Abdominal Pain  . Emesis    HPI Valerie Hancock is a 25 y.o. female.  HPI Valerie Hancock is a 25 y.o. female with history of Bell's palsy, presents to emergency department complaining of left facial pain, nausea, vomiting. Patient states left facial pain began yesterday. She denies any facial swelling. Denies any facial trauma. Denies any toothaches. States pain similar to when she had Bell's palsy. She did not take any medications for this. She reports nausea and vomiting began this morning. She reports 2 episodes of emesis, constant nausea. Denies any abdominal pain. Denies any diarrhea. No urinary symptoms. Denies being pregnant. She states she ate cookout last night around 8:30 PM. She denies anyone in her family with the same symptoms.  Past Medical History:  Diagnosis Date  . Bell palsy     Patient Active Problem List   Diagnosis Date Noted  . VAGINAL DISCHARGE 07/21/2010  . DYSURIA 12/04/2009  . OTHER NONSPECIFIC FINDING EXAMINATION OF URINE 12/04/2009  . BREAST CYST, RIGHT 11/04/2009  . BELLS PALSY 02/21/2009  . DERMATOPHYTOSIS OF FOOT 01/01/2009  . OBESITY 01/01/2009    History reviewed. No pertinent surgical history.  OB History    Gravida Para Term Preterm AB Living   1             SAB TAB Ectopic Multiple Live Births                   Home Medications    Prior to Admission medications   Medication Sig Start Date End Date Taking? Authorizing Provider  ciprofloxacin (CIPRO) 500 MG tablet Take 1 tablet (500 mg total) by mouth 2 (two) times daily. 02/09/16   Deatra Canter, FNP  ibuprofen (ADVIL,MOTRIN) 200 MG tablet Take 200 mg by mouth every 6 (six) hours as needed for mild pain.    Historical Provider, MD  phenazopyridine (PYRIDIUM) 200 MG tablet Take 1 tablet (200 mg total) by mouth 3  (three) times daily. 02/09/16   Deatra Canter, FNP  terbinafine (LAMISIL) 250 MG tablet Take 1 tablet (250 mg total) by mouth daily. 12/18/15   Lenn Sink, DPM    Family History No family history on file.  Social History Social History  Substance Use Topics  . Smoking status: Never Smoker  . Smokeless tobacco: Never Used  . Alcohol use Yes     Comment: occasionally     Allergies   Review of patient's allergies indicates no known allergies.   Review of Systems Review of Systems  Constitutional: Negative for chills and fever.  HENT: Negative for congestion, dental problem, facial swelling, sinus pressure and sore throat.   Respiratory: Negative for cough, chest tightness and shortness of breath.   Cardiovascular: Negative for chest pain, palpitations and leg swelling.  Gastrointestinal: Positive for abdominal pain, nausea and vomiting. Negative for diarrhea.  Genitourinary: Negative for dysuria, flank pain, pelvic pain, vaginal bleeding, vaginal discharge and vaginal pain.  Musculoskeletal: Negative for arthralgias, myalgias, neck pain and neck stiffness.  Skin: Negative for rash.  Neurological: Positive for headaches. Negative for dizziness and weakness.  All other systems reviewed and are negative.    Physical Exam Updated Vital Signs Temp 98.6 F (37 C) (Oral)   Ht 5\' 7"  (1.702 m)   Wt 116.6 kg  LMP 03/22/2016   BMI 40.25 kg/m   Physical Exam  Constitutional: She is oriented to person, place, and time. She appears well-developed and well-nourished. No distress.  HENT:  Head: Normocephalic.  Eyes: Conjunctivae and EOM are normal. Pupils are equal, round, and reactive to light.  Neck: Neck supple.  Cardiovascular: Normal rate, regular rhythm and normal heart sounds.   Pulmonary/Chest: Effort normal and breath sounds normal. No respiratory distress. She has no wheezes. She has no rales.  Abdominal: Soft. Bowel sounds are normal. She exhibits no distension.  There is no tenderness. There is no rebound and no guarding.  Musculoskeletal: She exhibits no edema.  Neurological: She is alert and oriented to person, place, and time. No cranial nerve deficit.  Skin: Skin is warm and dry.  Psychiatric: She has a normal mood and affect. Her behavior is normal.  Nursing note and vitals reviewed.    ED Treatments / Results  Labs (all labs ordered are listed, but only abnormal results are displayed) Labs Reviewed  CBC WITH DIFFERENTIAL/PLATELET - Abnormal; Notable for the following:       Result Value   Hemoglobin 11.5 (*)    HCT 35.6 (*)    All other components within normal limits  COMPREHENSIVE METABOLIC PANEL - Abnormal; Notable for the following:    Glucose, Bld 109 (*)    Calcium 8.8 (*)    AST 13 (*)    ALT 10 (*)    Total Bilirubin 0.1 (*)    All other components within normal limits  LIPASE, BLOOD  URINALYSIS, ROUTINE W REFLEX MICROSCOPIC (NOT AT [facility])  I-STAT BETA HCG BLOOD, ED ([facility], [facility], [facility] ONLY)    EKG  EKG Interpretation None       Radiology No results found.  Procedures Procedures (including critical care time)  Medications Ordered in ED Medications  sodium chloride 0.9 % bolus 1,000 mL (not administered)  ondansetron (ZOFRAN) injection 4 mg (not administered)     Initial Impression / Assessment and Plan / ED Course  I have reviewed the triage vital signs and the nursing notes.  Pertinent labs & imaging results that were available during my care of the patient were reviewed by me and considered in my medical decision making (see chart for details).  Clinical Course    Patient seen and examined. Patient with left-sided facial pain, on exam no facial drooping or difficulty closing her eye.  Question early onset Bell's palsy. No evidence of dental infection. No sinus tenderness. She does have decreased sensation to the left side of the face. Also with acute onset of nausea and vomiting this morning. No abdominal  tenderness on exam. Will check labs, UA, preg. Fluids and zofran ordered.   7:53 AM Pt received zofran and 1L of NS. No more emesis in ED. Feels better. Labs with no significant abnormalities. Will PO challenge.   9:19 AM UA normal. Patient is able to drink with no episodes of vomiting. Abdomen continues to be nontender. Most likely viral gastroenteritis. Will discharge home with Zofran. Follow up with primary care doctor as needed. Return precautions discussed.  Vitals:   03/30/16 0800 03/30/16 0815 03/30/16 0830 03/30/16 0901  BP: 141/85 123/75 127/95 115/73  Pulse: 70 71 77 75  Resp:    20  Temp:      TempSrc:      SpO2: 100% 99% 97% 98%  Weight:      Height:         Final Clinical Impressions(s) /  ED Diagnoses   Final diagnoses:  Non-intractable vomiting with nausea, unspecified vomiting type    New Prescriptions New Prescriptions   No medications on file     Jaynie Crumbleatyana Sawyer [place]zer, PA-C 03/30/16 [id number]    Gilda Creasehristopher J Pollina, MD 04/01/16 [phone]

## 2016-03-30 NOTE — ED Triage Notes (Signed)
Pt in from home with c/o abd pain and vomitting since 0430 this morning, also c/o L sided face pain. Pt states face pain started yesterday, hx of Bell's Palsy, minor L sided droop noted. Able to move all extremities equally. Pt woke this morning with sharp abd pain, vomitted x 2 today. Denies dizziness, sob or cp.

## 2016-05-18 ENCOUNTER — Ambulatory Visit (HOSPITAL_COMMUNITY)
Admission: EM | Admit: 2016-05-18 | Discharge: 2016-05-18 | Disposition: A | Discharge status: Home / Self Care | Payer: BLUE CROSS/BLUE SHIELD | Attending: Family Medicine | Admitting: Family Medicine

## 2016-05-18 ENCOUNTER — Emergency Department (HOSPITAL_COMMUNITY): Payer: BLUE CROSS/BLUE SHIELD

## 2016-05-18 ENCOUNTER — Encounter (HOSPITAL_COMMUNITY): Payer: Self-pay | Admitting: Emergency Medicine

## 2016-05-18 ENCOUNTER — Emergency Department (HOSPITAL_COMMUNITY)
Admission: EM | Admit: 2016-05-18 | Discharge: 2016-05-19 | Disposition: A | Discharge status: Home / Self Care | Payer: BLUE CROSS/BLUE SHIELD | Attending: Emergency Medicine | Admitting: Emergency Medicine

## 2016-05-18 DIAGNOSIS — R51 Headache: Secondary | ICD-10-CM | POA: Insufficient documentation

## 2016-05-18 DIAGNOSIS — Z7982 Long term (current) use of aspirin: Secondary | ICD-10-CM | POA: Insufficient documentation

## 2016-05-18 DIAGNOSIS — R519 Headache, unspecified: Secondary | ICD-10-CM

## 2016-05-18 DIAGNOSIS — R208 Other disturbances of skin sensation: Secondary | ICD-10-CM | POA: Diagnosis not present

## 2016-05-18 DIAGNOSIS — R200 Anesthesia of skin: Secondary | ICD-10-CM | POA: Diagnosis not present

## 2016-05-18 LAB — COMPREHENSIVE METABOLIC PANEL
ALBUMIN: 3.8 g/dL (ref 3.5–5.0)
ALT: 16 U/L (ref 14–54)
AST: 19 U/L (ref 15–41)
Alkaline Phosphatase: 54 U/L (ref 38–126)
Anion gap: 10 (ref 5–15)
BUN: 9 mg/dL (ref 6–20)
CHLORIDE: 106 mmol/L (ref 101–111)
CO2: 22 mmol/L (ref 22–32)
CREATININE: 0.75 mg/dL (ref 0.44–1.00)
Calcium: 9.2 mg/dL (ref 8.9–10.3)
GFR calc Af Amer: >60 mL/min (ref >60)
GLUCOSE: 77 mg/dL (ref 65–99)
POTASSIUM: 3.9 mmol/L (ref 3.5–5.1)
Sodium: 138 mmol/L (ref 135–145)
Total Bilirubin: 0.6 mg/dL (ref 0.3–1.2)
Total Protein: 7.7 g/dL (ref 6.5–8.1)

## 2016-05-18 LAB — CBC WITH DIFFERENTIAL/PLATELET
BASOS ABS: 0 10*3/uL (ref 0.0–0.1)
BASOS PCT: 0 %
EOS PCT: 1 %
Eosinophils Absolute: 0.1 10*3/uL (ref 0.0–0.7)
HEMATOCRIT: 36.2 % (ref 36.0–46.0)
Hemoglobin: 12 g/dL (ref 12.0–15.0)
LYMPHS PCT: 31 %
Lymphs Abs: 3.2 10*3/uL (ref 0.7–4.0)
MCH: 28 pg (ref 26.0–34.0)
MCHC: 33.1 g/dL (ref 30.0–36.0)
MCV: 84.6 fL (ref 78.0–100.0)
Monocytes Absolute: 0.6 10*3/uL (ref 0.1–1.0)
Monocytes Relative: 6 %
NEUTROS ABS: 6.4 10*3/uL (ref 1.7–7.7)
Neutrophils Relative %: 62 %
PLATELETS: 292 10*3/uL (ref 150–400)
RBC: 4.28 MIL/uL (ref 3.87–5.11)
RDW: 14.3 % (ref 11.5–15.5)
WBC: 10.3 10*3/uL (ref 4.0–10.5)

## 2016-05-18 LAB — TSH: TSH: 2.171 u[IU]/mL (ref 0.350–4.500)

## 2016-05-18 LAB — VITAMIN B12: VITAMIN B 12: 348 pg/mL (ref 180–914)

## 2016-05-18 MED ORDER — GADOBENATE DIMEGLUMINE 529 MG/ML IV SOLN
20.0000 mL | Freq: Once | INTRAVENOUS | Status: AC | PRN
  Administered 2016-05-18: 20 mL via INTRAVENOUS

## 2016-05-18 NOTE — ED Notes (Signed)
Patient given Ice.

## 2016-05-18 NOTE — ED Provider Notes (Signed)
CSN: [id number]     Arrival date & time 05/18/16  1100 History   First MD Initiated Contact with Patient 05/18/16 1236     Chief Complaint  Patient presents with  . Numbness   HPI  Patient is a 25yo female with PMH of Bells Palsy who presents with headache and left facial numbness and weakness. Patient reports of a left sided HA that is throbbing that was intermittent which started last week. No radiation of pain, no eye pain, No blurred vision, No ear pain or ringing. She started to have left facial numbness yesterday morning along with left facial weakness and eye tearing which are similar to her bells palsy symptoms. She reports that she was diagnosed  with bells palsy in 2010. Since then her speech would be intermittently slurred. Also would have intermittent numbness of her left face and left eye tearing. Reports she has episodes of these symptoms every month. What brought her in today is the headache associated with her facial symptoms which is new. She does not have a history of migraines. But does have headaches but this HA is not the same. Usually her headache is bilateral.  She has been trying Excedrin Extra strength for hear headaches but has not helped; last dose was this morning around 5AM. This usually helps her headaches. She reports of subjective weakness of her entire left side since her diagnosis of Bells Palsy; this is persistent and she reports her left hand may be a little weaker over the past month. [facility] per chart review. She reports that she had a MRI completed which was normal, but never followed up afterwards.   She has no PCP currently,but is in the process of being [facility].   Past Medical History:  Diagnosis Date  . Bell palsy    History reviewed. No pertinent surgical history. No family history on file. Social History  Substance Use Topics  . Smoking status: Never Smoker   . Smokeless tobacco: Never Used  . Alcohol use Yes     Comment: occasionally   OB History    Gravida Para Term Preterm AB Living   1             SAB TAB Ectopic Multiple Live Births                 Review of Systems: note above  Allergies  Patient has no known allergies.  Home Medications   Prior to Admission medications   Medication Sig Start Date End Date Taking? Authorizing Provider  Aspirin-Acetaminophen-Caffeine (EXCEDRIN PO) Take by mouth.   Yes Historical Provider, MD  ciprofloxacin (CIPRO) 500 MG tablet Take 1 tablet (500 mg total) by mouth 2 (two) times daily. Patient not taking: Reported on 05/18/2016 02/09/16   Deatra CanterWilliam J Oxford, FNP  ibuprofen (ADVIL,MOTRIN) 200 MG tablet Take 200 mg by mouth every 6 (six) hours as needed for mild pain.    Historical Provider, MD  ondansetron (ZOFRAN ODT) 8 MG disintegrating tablet Take 1 tablet (8 mg total) by mouth every 8 (eight) hours as needed for nausea or vomiting. 03/30/16   Tatyana Kirichenko, PA-C  phenazopyridine (PYRIDIUM) 200 MG tablet Take 1 tablet (200 mg total) by mouth 3 (three) times daily. Patient not taking: Reported on 05/18/2016 02/09/16   Deatra CanterWilliam J Oxford, FNP  terbinafine (LAMISIL) 250 MG tablet Take 1 tablet (250 mg total) by mouth daily. Patient not taking: Reported  on 05/18/2016 12/18/15   Lenn Sink, DPM   Meds Ordered and Administered this Visit  Medications - No data to display  BP 105/66 (BP Location: Left Arm) Comment (BP Location): large cuff  Pulse 76   Temp 99 F (37.2 C) (Oral)   Resp 16   LMP 05/15/2016   SpO2 100%  No data found.  Physical Exam  Constitutional: She is oriented to person, place, and time. She appears well-developed and well-nourished. No distress.  HENT:  Head: Normocephalic and atraumatic.  Right Ear: External ear normal.  Left Ear: External ear normal.  Nose: Nose normal.  Mouth/Throat: Oropharynx is clear and moist.  Eyes: Conjunctivae and EOM are normal. Pupils are  equal, round, and reactive to light. Right eye exhibits no discharge. Left eye exhibits no discharge. No scleral icterus.  Neck: Normal range of motion. Neck supple.  Cardiovascular: Normal rate and regular rhythm.  Exam reveals no gallop and no friction rub.   No murmur heard. Pulmonary/Chest: Effort normal and breath sounds normal.  Lymphadenopathy:    She has no cervical adenopathy.  Neurological: She is alert and oriented to person, place, and time. She displays normal reflexes. A cranial nerve deficit is present. No sensory deficit. She exhibits normal muscle tone. Coordination normal.  Slight facial droop on the left side, slight asymmetric smile on the left, unable to raise left eyebrow as well as right. Reports of decreased sensation to light touch on the left face.   Normal strength of upper and lower extremities bilaterally   Skin: Skin is warm and dry.  Psychiatric: She has a normal mood and affect. Her behavior is normal.    Urgent Care Course   Clinical Course   I was able to get in touch with Dr. Marjory Lies from [facility] who saw patient in 09/2009. He reports that she had an MRI at that time which was normal, but patient never returned to clinic to get blood work. Dicussed presentation with Dr. Marjory Lies, who recommended that patient obtain a MRI brain with and without contrast with attention to internal auditory canal for further evaluation due to new headache with facial symptoms. He also recommended that if possible, for ED to obtain the following blood work as patient had history of issues with follow up. He recommended the following labs: CBC, CMP, GTT, HbA1c, ANA, ACE, HIV, RPR, B12, TSH, lyme titers.   I made an ambulatory referral to neurology, specifically to [facility] Dr. Marjory Lies for patient to follow up.   Procedures   Labs Review Labs Reviewed - No data to display  Imaging Review No results found.   MDM   1.  Left facial numbness   2. Nonintractable episodic headache, unspecified headache type    I was able to get in touch with Dr. Marjory Lies from [facility] who saw patient in 09/2009. He reports that she had an MRI at that time which was normal, but patient never returned to clinic to get blood work. Dicussed presentation with Dr. Marjory Lies, who recommended that patient obtain a MRI brain with and without contrast with attention to internal auditory canal for further evaluation due to new headache with facial symptoms and history of intermittent neurological symptoms. He also recommended that if possible, for ED to obtain the following blood work as patient had history of issues with follow up. He recommended the following labs: CBC, CMP, GTT, HbA1c, ANA, ACE, HIV, RPR, B12, TSH, lyme titers. Discussed with patient about  the plan to go to ED to obtain the recommended evaluation. Patient agreed.   I made an ambulatory referral to neurology, [facility] Dr. Marjory LiesPenumalli for patient to follow up.   Discussed assessment and plan with Mr. Hayden RasmussenDavid Mabe, nurse practitioner and Dr. Artis FlockKindl.   Palma HolterKanishka G Harjot Zavadil, MD PGY 2 Family Medicine    Palma HolterKanishka G Rosebud Koenen, MD 05/18/16 1430    Palma HolterKanishka G Donovyn Guidice, MD 05/18/16 [phone]

## 2016-05-18 NOTE — ED Triage Notes (Signed)
Onset last week of headache pain on left side of face/head.  Altered sensation to left side of face.  Patient reports having bell's palsy 2010 and this feels the same.  asymmetric smile, left facial droop

## 2016-05-18 NOTE — Discharge Instructions (Signed)
After speaking to the neurologist, he recommend that you have an MRI of your brain with and without contrast with attention to the internal auditory canal since you have been having this new headache along with your bell's palsy symptoms. He also recommended the following lab work: CBC, CMP, GTT, HbA1c, ANA, ACE, HIV, RPR, B12, TSH, Lyme titer. We made a referral to the neurologist [facility] Dr.  Dr. Marjory LiesPenumalli ) and they will follow up with you as an outpatient. Please go to the ED now to get your imaging and blood work.

## 2016-05-18 NOTE — ED Notes (Signed)
Report given to Jasmine RN

## 2016-05-18 NOTE — ED Triage Notes (Signed)
[facility] further eval of left sided HA and possible flare of bells palsy

## 2016-05-19 LAB — HIV ANTIBODY (ROUTINE TESTING W REFLEX): HIV Screen 4th Generation wRfx: NONREACTIVE

## 2016-05-19 LAB — ROCKY MTN SPOTTED FVR ABS PNL(IGG+IGM)
RMSF IGG: NEGATIVE
RMSF IGM: 0.87 {index} (ref 0.00–0.89)

## 2016-05-19 LAB — RPR: RPR: NONREACTIVE

## 2016-05-19 NOTE — ED Provider Notes (Signed)
[facility]-EMERGENCY DEPT Provider Note   CSN: [id number] Arrival date & time: 05/18/16  1432     History   Chief Complaint Chief Complaint  Patient presents with  . Headache    HPI EMBRY MANRIQUE is a 25 y.o. female.  The history is provided by the patient. No language interpreter was used.  Headache   This is a recurrent problem. The current episode started more than 1 week ago. The problem occurs constantly. The problem has been gradually worsening. The headache is associated with nothing. The pain is located in the left unilateral region. The pain is moderate. The pain does not radiate. Pertinent negatives include no nausea and no vomiting. She has tried nothing for the symptoms. The treatment provided no relief.   Pt complains of pain in her head earlier.  Pt reports she has bell's palsy that comes and goes.  Pt reports she began having numbness yesterday.  Pt reports she was concerned that her speech was slurred.  Pt reports she was first diagnosed with Bell's palsy in 2010. Pt was seen at Urgent care and sent to ED for labs and MRi per advice of Dr. Danae Orleans.   Past Medical History:  Diagnosis Date  . Bell palsy     Patient Active Problem List   Diagnosis Date Noted  . VAGINAL DISCHARGE 07/21/2010  . DYSURIA 12/04/2009  . OTHER NONSPECIFIC FINDING EXAMINATION OF URINE 12/04/2009  . BREAST CYST, RIGHT 11/04/2009  . BELLS PALSY 02/21/2009  . DERMATOPHYTOSIS OF FOOT 01/01/2009  . OBESITY 01/01/2009    History reviewed. No pertinent surgical history.  OB History    Gravida Para Term Preterm AB Living   1             SAB TAB Ectopic Multiple Live Births                   Home Medications    Prior to Admission medications   Medication Sig Start Date End Date Taking? Authorizing Provider  Aspirin-Acetaminophen-Caffeine (EXCEDRIN PO) Take by mouth.   Yes Historical Provider, MD  ibuprofen (ADVIL,MOTRIN) 200 MG tablet Take 200 mg by mouth every 6 (six) hours as needed  for mild pain.   Yes Historical Provider, MD  terbinafine (LAMISIL) 250 MG tablet Take 1 tablet (250 mg total) by mouth daily. 12/18/15  Yes Lenn Sink, DPM    Family History History reviewed. No pertinent family history.  Social History Social History  Substance Use Topics  . Smoking status: Never Smoker  . Smokeless tobacco: Never Used  . Alcohol use Yes     Comment: occasionally     Allergies   Patient has no known allergies.   Review of Systems Review of Systems  Gastrointestinal: Negative for nausea and vomiting.  Neurological: Positive for headaches.  All other systems reviewed and are negative.    Physical Exam Updated Vital Signs BP 114/66   Pulse 70   Temp 98.2 F (36.8 C) (Oral)   Resp 22   LMP 05/15/2016   SpO2 99%   Physical Exam  Constitutional: She is oriented to person, place, and time. She appears well-developed and well-nourished.  HENT:  Head: Normocephalic.  Right Ear: External ear normal.  Left Ear: External ear normal.  Nose: Nose normal.  Mouth/Throat: Oropharynx is clear and moist.  Eyes: Conjunctivae and EOM are normal. Pupils are equal, round, and reactive to light.  Neck: Normal range of motion.  Cardiovascular: Normal rate and regular rhythm.  Pulmonary/Chest: Effort normal and breath sounds normal.  Abdominal: Soft. She exhibits no distension.  Musculoskeletal: Normal range of motion.  Neurological: She is alert and oriented to person, place, and time. A sensory deficit is present. No cranial nerve deficit. Coordination normal.  Pt lifts right eyelid higher than left,  Forehead normal,  Right side of mouth elevates higher than left with testing but is normal during conversation,  Normal blinking.    Skin: Skin is warm. Capillary refill takes less than 2 seconds.  Psychiatric: She has a normal mood and affect.  Nursing note and vitals reviewed.    ED Treatments / Results  Labs (all labs ordered are listed, but only abnormal  results are displayed) Labs Reviewed  CBC WITH DIFFERENTIAL/PLATELET  COMPREHENSIVE METABOLIC PANEL  VITAMIN B12  TSH  RPR  ROCKY MTN SPOTTED FVR ABS PNL(IGG+IGM)  HIV ANTIBODY (ROUTINE TESTING)  B. BURGDORFI ANTIBODIES    EKG  EKG Interpretation None       Radiology Mr Laqueta JeanBrain W And Wo Contrast  Result Date: 05/18/2016 CLINICAL DATA:  Initial evaluation for headache with left facial numbness and weakness. EXAM: MRI HEAD WITHOUT AND WITH CONTRAST TECHNIQUE: Multiplanar, multiecho pulse sequences of the brain and surrounding structures were obtained without and with intravenous contrast. CONTRAST:  20mL MULTIHANCE GADOBENATE DIMEGLUMINE 529 MG/ML IV SOLN COMPARISON:  Prior CT from 08/29/2011. FINDINGS: Brain: Cerebral volume within normal limits for age. No focal parenchymal signal abnormality identified. No abnormal foci of restricted diffusion to suggest acute or subacute ischemia. Gray-white matter differentiation is well maintained. No evidence for acute or chronic intracranial hemorrhage. No encephalomalacia to suggest chronic infarction. No mass lesion, midline shift, or mass effect. Ventricles are normal in size without evidence for hydrocephalus. Cavum septum pellucidum noted. No extra-axial fluid collection. Major dural sinuses are grossly patent. No abnormal enhancement identified within the brain. No definite enhancement about the seventh cranial nerve as would be expected with acute Bell's palsy, although evaluation somewhat limited on this non IAC protocol MRI of the brain. Pituitary gland within normal limits. Suprasellar region normal. Midline structures intact. Vascular: Major intracranial vascular flow voids preserved. Skull and upper cervical spine: Craniocervical junction normal. Visualized upper cervical spine within normal limits. Bone marrow signal intensity normal. No scalp soft tissue abnormality. Sinuses/Orbits: Globes and orbital soft tissues within normal limits.  Paranasal sinuses are grossly clear. No mastoid effusion. Inner ear structures grossly normal. IMPRESSION: Normal MRI of the brain. Electronically Signed   By: Rise MuBenjamin  McClintock M.D.   On: 05/18/2016 21:39    Procedures Procedures (including critical care time)  Medications Ordered in ED Medications  gadobenate dimeglumine (MULTIHANCE) injection 20 mL (20 mLs Intravenous Contrast Given 05/18/16 2114)     Initial Impression / Assessment and Plan / ED Course  I have reviewed the triage vital signs and the nursing notes.  Pertinent labs & imaging results that were available during my care of the patient were reviewed by me and considered in my medical decision making (see chart for details).  Clinical Course     MRI and labs per advise of Neurologist.  Glucose challenge and A1c deferred as pt has been eating.   Labs are normal,  MRi is normal.  Pt is advised to follow up with Neurologist/ primary care MD.    Final Clinical Impressions(s) / ED Diagnoses   Final diagnoses:  Bad headache  Decreased sensation    New Prescriptions New Prescriptions   No medications on file  Lonia SkinnerLeslie K NewportSofia, PA-C 05/19/16 0025    Canary Brimhristopher J Tegeler, MD 05/19/16 [phone]

## 2016-05-19 NOTE — Discharge Instructions (Signed)
Follow up with neurologist and primary care for recheck.

## 2018-01-15 IMAGING — MR MR HEAD WO/W CM
10 of 13 series · 30 of 48 positions shown · IV contrast (multihance)
Comparison: Prior CT from 08/29/2011.

CLINICAL DATA: Initial evaluation for headache with left facial
numbness and weakness.

EXAM:
MRI HEAD WITHOUT AND WITH CONTRAST
TECHNIQUE: Multiplanar, multiecho pulse sequences of the brain and surrounding
structures were obtained without and with intravenous contrast.
CONTRAST:  20mL MULTIHANCE GADOBENATE DIMEGLUMINE 529 MG/ML IV SOLN

[Series 3: DWI · axial · 3.0mm · 0.94mm/px · z∈[-95,+50]mm · 6 of 100 slices shown (1 of 2)]
[im 1/100]
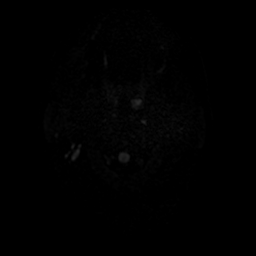
[im 20/100]
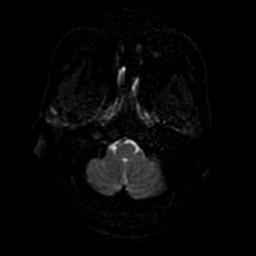
[im 40/100]
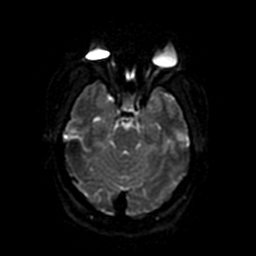
[im 60/100]
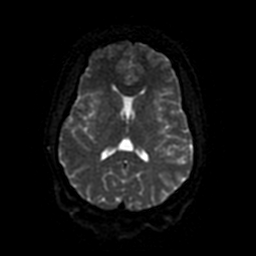
[im 80/100]
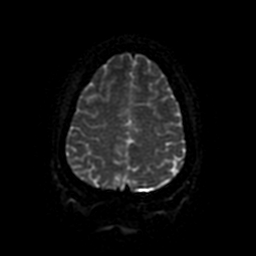
[im 100/100]
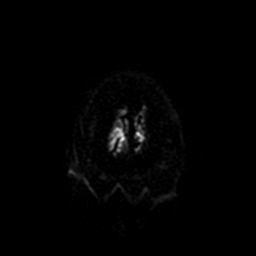

[Series 4: DWI · coronal · 4.0mm · 0.94mm/px · 5 of 68 slices shown (2 of 2)]
[im 1/68]
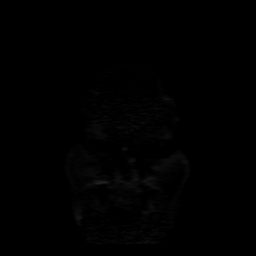
[im 17/68]
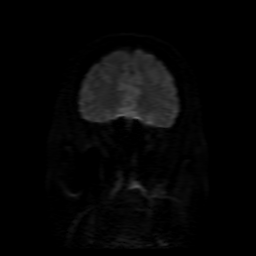
[im 34/68]
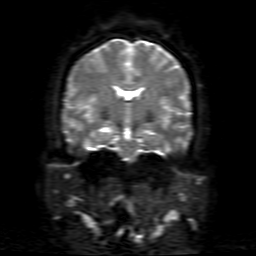
[im 51/68]
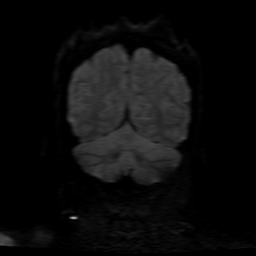
[im 68/68]
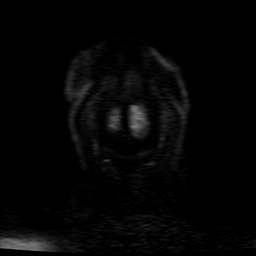

[Series 5: FLAIR · sagittal · 5.0mm · 0.47mm/px · 2 of 23 slices shown (1 of 2)]
[im 1/23]
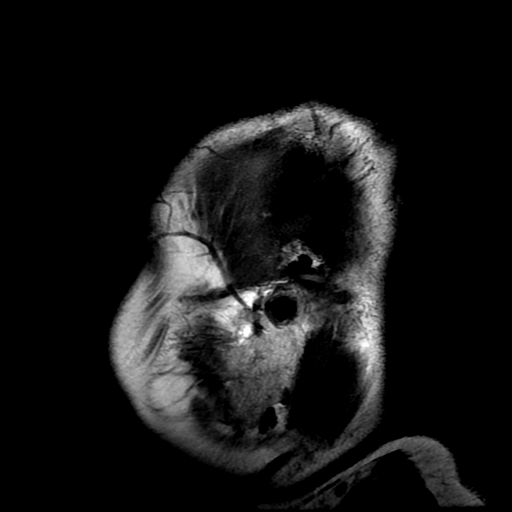
[im 23/23]
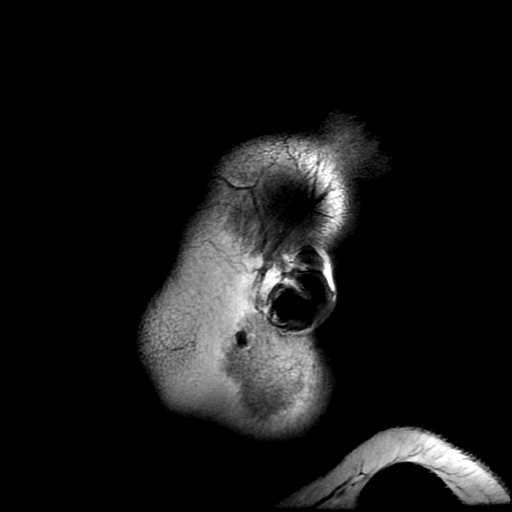

[Series 7: T2 · axial · 5.0mm · 0.47mm/px · z∈[-94,+48]mm · 2 of 25 slices shown]
[im 1/25]
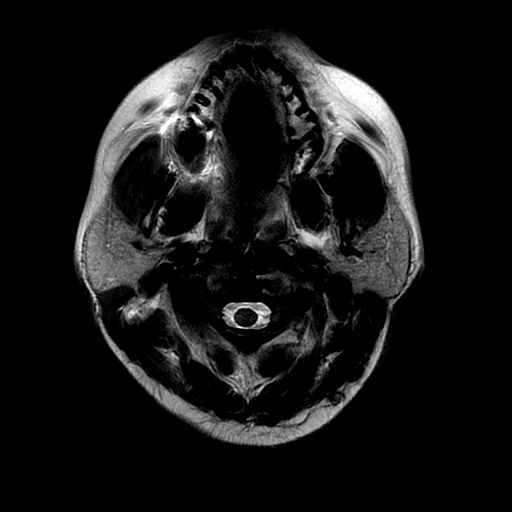
[im 25/25]
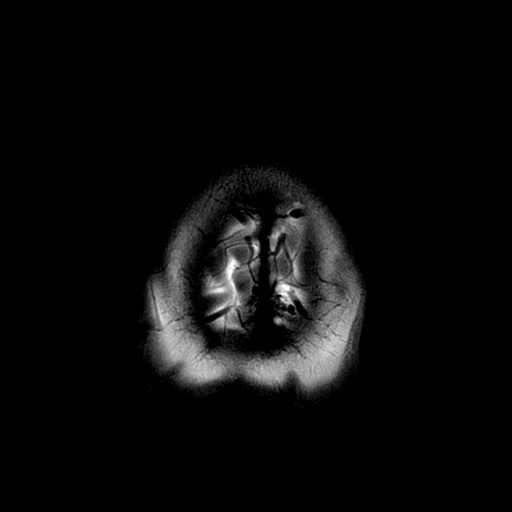

[Series 8: FLAIR · axial · 5.0mm · 0.47mm/px · z∈[-94,+48]mm · 2 of 25 slices shown (2 of 2)]
[im 1/25]
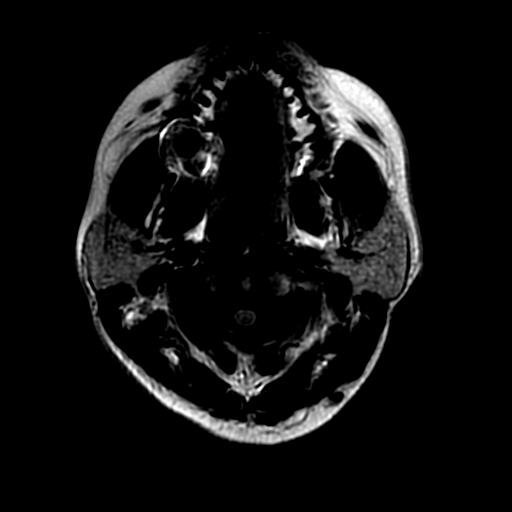
[im 25/25]
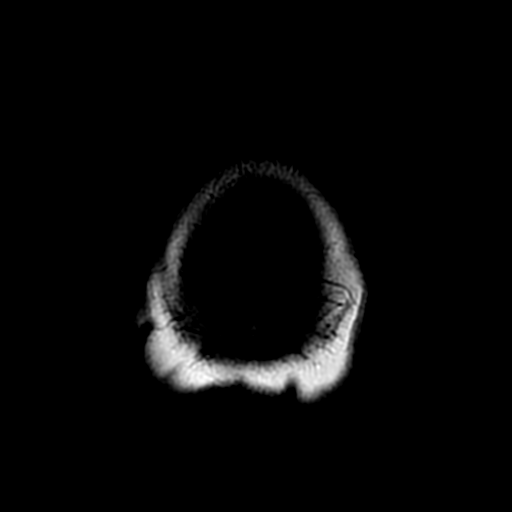

[Series 9: (person_name) · axial · 3.0mm · 0.47mm/px · z∈[-95,-23]mm · 4 of 100 slices shown]
[im 1/100]
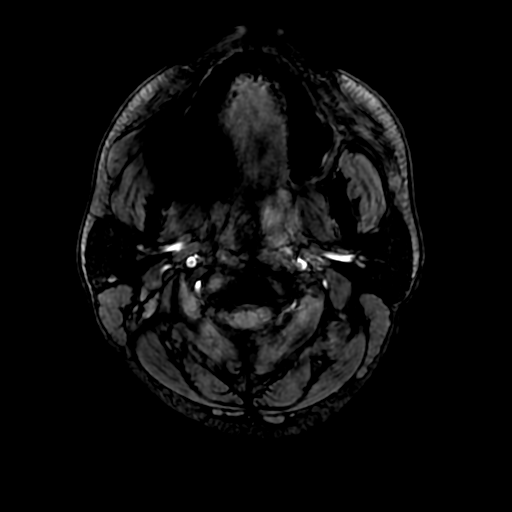
[im 17/100]
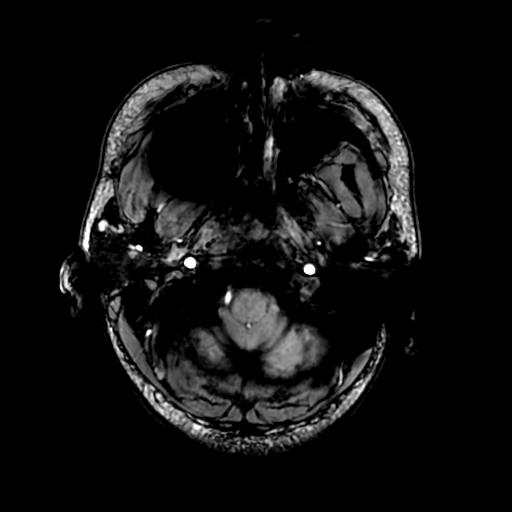
[im 34/100]
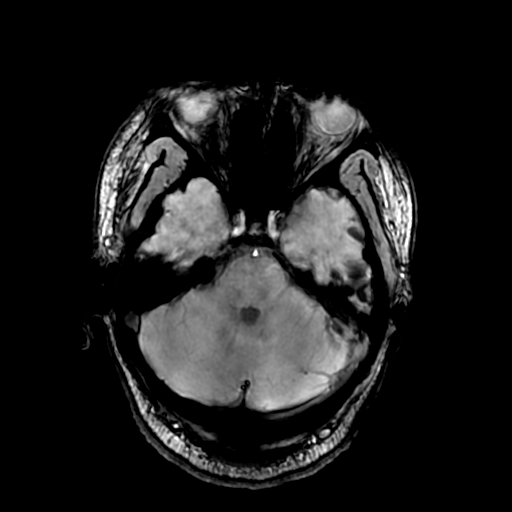
[im 50/100]
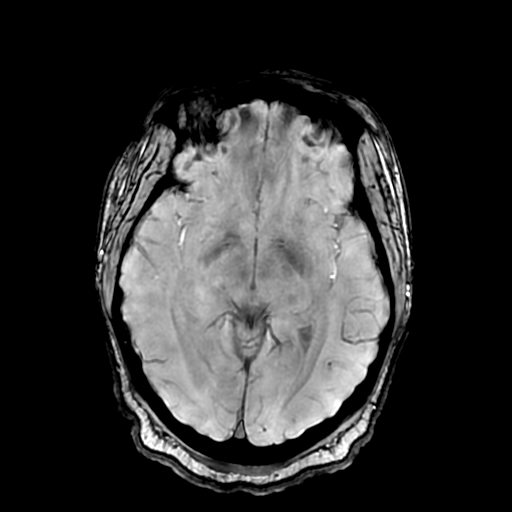

[Series 13: T2 post-contrast · coronal · 5.0mm · 0.39mm/px · 2 of 28 slices shown]
[im 1/28]
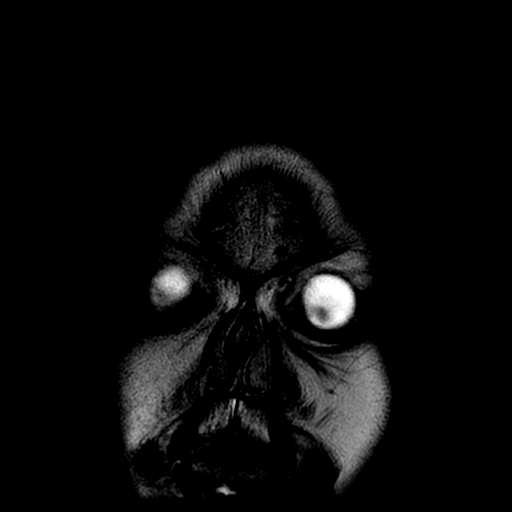
[im 28/28]
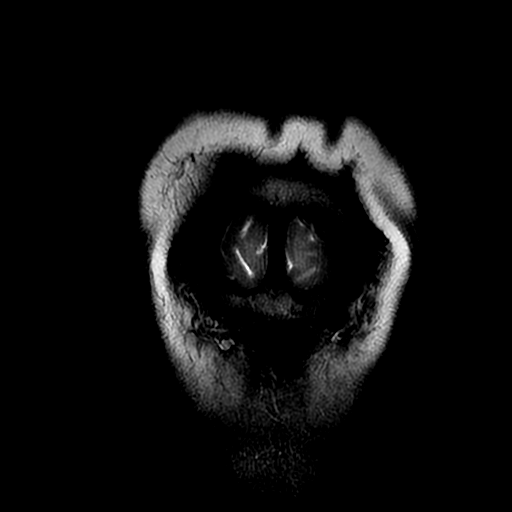

[Series 15: T1 · coronal · 5.0mm · 0.39mm/px · 2 of 28 slices shown]
[im 1/28]
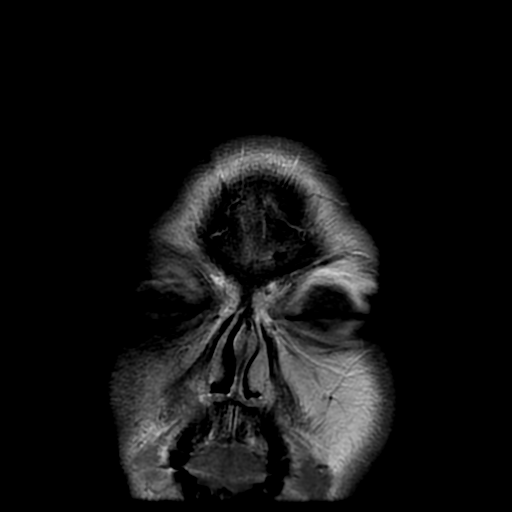
[im 28/28]
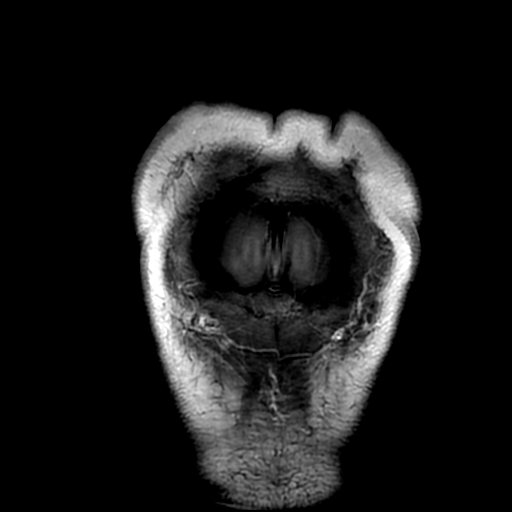

[Series 350: ADC · axial · 3.0mm · 0.94mm/px · z∈[-95,+50]mm · 3 of 50 slices shown (1 of 2)]
[im 1/50]
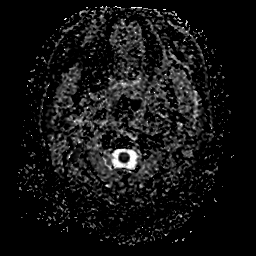
[im 25/50]
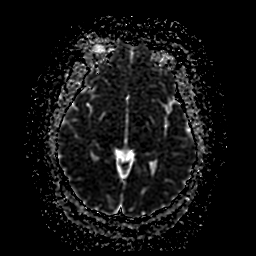
[im 50/50]
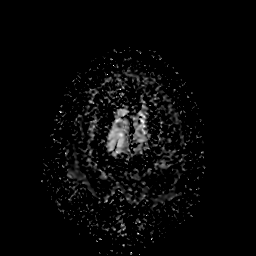

[Series 450: ADC · coronal · 4.0mm · 0.94mm/px · 2 of 34 slices shown (2 of 2)]
[im 1/34]
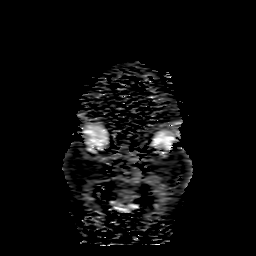
[im 34/34]
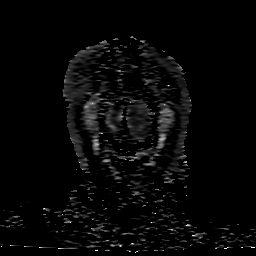

[30 of 48 positions shown; findings below may reference images not displayed]

FINDINGS: Brain: Cerebral volume within normal limits for age. No focal
parenchymal signal abnormality identified. No abnormal foci of
restricted diffusion to suggest acute or subacute ischemia.
Gray-white matter differentiation is well maintained. No evidence
for acute or chronic intracranial hemorrhage. No encephalomalacia to
suggest chronic infarction.

No mass lesion, midline shift, or mass effect. Ventricles are normal
in size without evidence for hydrocephalus. Cavum septum pellucidum
noted. No extra-axial fluid collection. Major dural sinuses are
grossly patent. No abnormal enhancement identified within the brain.
No definite enhancement about the seventh cranial nerve as would be
expected with acute Bell's palsy, although evaluation somewhat
limited on this non IAC protocol MRI of the brain.

Pituitary gland within normal limits. Suprasellar region normal.
Midline structures intact.

Vascular: Major intracranial vascular flow voids preserved.

Skull and upper cervical spine: Craniocervical junction normal.
Visualized upper cervical spine within normal limits. Bone marrow
signal intensity normal. No scalp soft tissue abnormality.

Sinuses/Orbits: Globes and orbital soft tissues within normal
limits. Paranasal sinuses are grossly clear. No mastoid effusion.
Inner ear structures grossly normal.
IMPRESSION: Normal MRI of the brain.

## 2018-10-10 ENCOUNTER — Other Ambulatory Visit: Payer: Self-pay

## 2018-10-10 ENCOUNTER — Emergency Department (HOSPITAL_COMMUNITY): Payer: BLUE CROSS/BLUE SHIELD

## 2018-10-10 ENCOUNTER — Emergency Department (HOSPITAL_COMMUNITY)
Admission: EM | Admit: 2018-10-10 | Discharge: 2018-10-10 | Disposition: A | Discharge status: Home / Self Care | Payer: BLUE CROSS/BLUE SHIELD | Attending: Emergency Medicine | Admitting: Emergency Medicine

## 2018-10-10 DIAGNOSIS — X500XXA Overexertion from strenuous movement or load, initial encounter: Secondary | ICD-10-CM | POA: Insufficient documentation

## 2018-10-10 DIAGNOSIS — Y9389 Activity, other specified: Secondary | ICD-10-CM | POA: Insufficient documentation

## 2018-10-10 DIAGNOSIS — Y990 Civilian activity done for income or pay: Secondary | ICD-10-CM | POA: Insufficient documentation

## 2018-10-10 DIAGNOSIS — Y92513 Shop (commercial) as the place of occurrence of the external cause: Secondary | ICD-10-CM | POA: Diagnosis not present

## 2018-10-10 DIAGNOSIS — M25512 Pain in left shoulder: Secondary | ICD-10-CM | POA: Diagnosis not present

## 2018-10-10 MED ORDER — METHOCARBAMOL 500 MG PO TABS: 500.0000 mg | ORAL_TABLET | Freq: Two times a day (BID) | ORAL | 0 refills | Status: AC

## 2018-10-10 MED ORDER — NAPROXEN 500 MG PO TABS: 500.0000 mg | ORAL_TABLET | Freq: Two times a day (BID) | ORAL | 0 refills | Status: AC

## 2018-10-10 NOTE — ED Notes (Signed)
Patient transported to X-ray 

## 2018-10-10 NOTE — Discharge Instructions (Addendum)
I have prescribed muscle relaxers for your pain, please do not drink or drive while taking this medications as they can make you drowsy.  Please follow-up with PCP in 1 week for reevaluation of your symptoms.  If you experience any fever, vomiting, swelling to your left shoulder please return to the ED.

## 2018-10-10 NOTE — ED Notes (Signed)
ED Provider at bedside. 

## 2018-10-10 NOTE — ED Provider Notes (Addendum)
[facility]-EMERGENCY DEPT Provider Note   CSN: [id number] Arrival date & time: 10/10/18  5732    History   Chief Complaint Chief Complaint  Patient presents with  . Shoulder Pain    HPI TIAUNNA BUFORD is a 28 y.o. female.     28 y.o female with a PMH of Bells Palsy presents to the ED with a chief complaint of left shoulder pain x 1 month. Patient reports an ache to her left shoulder which radiates down her left arm to her hand.  She reports this pain is worse with movement of the left shoulder, is somewhat alleviated with muscle cream which she placed last night.  She reports this pain has been ongoing for 1 month but reports her symptoms have been getting worse.  She has taken some Advil to help with her pain, some improvement noted.  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  Also endorses some chest pressure worse with bending down, located to her left chest. She denies any shortness of breath, fever, nausea vomiting or previous cardiac history.      Past Medical History:  Diagnosis Date  . Bell palsy     Patient Active Problem List   Diagnosis Date Noted  . VAGINAL DISCHARGE 07/21/2010  . DYSURIA 12/04/2009  . OTHER NONSPECIFIC FINDING EXAMINATION OF URINE 12/04/2009  . BREAST CYST, RIGHT 11/04/2009  . BELLS PALSY 02/21/2009  . DERMATOPHYTOSIS OF FOOT 01/01/2009  . OBESITY 01/01/2009    No past surgical history on file.   OB History    Gravida  1   Para      Term      Preterm      AB      Living        SAB      TAB      Ectopic      Multiple      Live Births               Home Medications    Prior to Admission medications   Medication Sig Start Date End Date Taking? Authorizing Provider  ibuprofen (ADVIL,MOTRIN) 200 MG tablet Take 400-800 mg by mouth every 6 (six) hours as needed for headache, mild pain or cramping.    Yes [provider]  Menthol-Methyl  Salicylate (MUSCLE RUB) 10-15 % CREA Apply 1 application topically as needed for muscle pain (shoulder).   Yes [provider]  methocarbamol (ROBAXIN) 500 MG tablet Take 1 tablet (500 mg total) by mouth 2 (two) times daily for 7 days. 10/10/18 10/17/18  Claude Manges, PA-C  naproxen (NAPROSYN) 500 MG tablet Take 1 tablet (500 mg total) by mouth 2 (two) times daily for 7 days. 10/10/18 10/17/18  Claude Manges, PA-C  terbinafine (LAMISIL) 250 MG tablet Take 1 tablet (250 mg total) by mouth daily. Patient not taking: Reported on 10/10/2018 12/18/15   Lenn Sink, DPM    Family History No family history on file.  Social History Social History   Tobacco Use  . Smoking status: Never Smoker  . Smokeless tobacco: Never Used  Substance Use Topics  . Alcohol use: Yes    Comment: occasionally  . Drug use: No     Allergies   Patient has no known allergies.   Review of Systems Review of Systems  Constitutional: Negative for chills and fever.  HENT: Negative for ear pain and sore throat.   Eyes: Negative  for pain and visual disturbance.  Respiratory: Negative for cough and shortness of breath.   Cardiovascular: Negative for chest pain and palpitations.  Gastrointestinal: Negative for abdominal pain and vomiting.  Genitourinary: Negative for dysuria and hematuria.  Musculoskeletal: Positive for arthralgias. Negative for back pain and myalgias.  Skin: Negative for color change and rash.  Neurological: Negative for seizures and syncope.  All other systems reviewed and are negative.    Physical Exam Updated Vital Signs BP (!) 142/84   Pulse 86   Temp 98.8 F (37.1 C) (Oral)   Resp 14   Ht 5\' 7"  (1.702 m)   Wt 104.8 kg   SpO2 99%   BMI 36.18 kg/m   Physical Exam Vitals signs and nursing note reviewed.  Constitutional:      General: She is not in acute distress.    Appearance: She is well-developed.  HENT:     Head: Normocephalic and atraumatic.     Mouth/Throat:      Pharynx: No oropharyngeal exudate.  Eyes:     Pupils: Pupils are equal, round, and reactive to light.  Neck:     Musculoskeletal: Normal range of motion.  Cardiovascular:     Rate and Rhythm: Regular rhythm.     Heart sounds: Normal heart sounds.  Pulmonary:     Effort: Pulmonary effort is normal. No respiratory distress.     Breath sounds: Normal breath sounds.  Abdominal:     General: Bowel sounds are normal. There is no distension.     Palpations: Abdomen is soft.     Tenderness: There is no abdominal tenderness.  Musculoskeletal:        General: No deformity.     Left shoulder: She exhibits tenderness, bony tenderness and pain. She exhibits normal range of motion, no swelling, no effusion, no crepitus, no deformity, no laceration, no spasm, normal pulse and normal strength.     Right lower leg: No edema.     Left lower leg: No edema.     Comments: TTP along the humeral head, full ROM. Pulses present, capillary refill is intact.   Skin:    General: Skin is warm and dry.  Neurological:     Mental Status: She is alert and oriented to person, place, and time.     Comments: Alert, oriented, thought content appropriate. Speech fluent without evidence of aphasia. Able to follow 2 step commands without difficulty.  Cranial Nerves:  II:  Peripheral visual fields grossly normal, pupils, round, reactive to light III,IV, VI: ptosis not present, extra-ocular motions intact bilaterally  V,VII: smile symmetric, facial light touch sensation equal VIII: hearing grossly normal bilaterally  IX,X: midline uvula rise  XI: bilateral shoulder shrug equal and strong XII: midline tongue extension  Motor:  5/5 in upper extremities bilaterally including strong and equal grip strength.       ED Treatments / Results  Labs (all labs ordered are listed, but only abnormal results are displayed) Labs Reviewed - No data to display  EKG EKG Interpretation  Date/Time:  Tuesday October 10 2018 08:39:01  EDT Ventricular Rate:  71 PR Interval:    QRS Duration: 83 QT Interval:  351 QTC Calculation: 382 R Axis:   56 Text Interpretation:  Sinus rhythm Low voltage, precordial leads ST elevation, consider inferior injury Baseline wander in lead(s) II III aVF V3 V5 V6 No significant change since last tracing Confirmed by Jacalyn LefevreHaviland, Julie [phone]([id number]) on 10/10/2018 8:41:19 AM   Radiology Dg Shoulder Left  Result  Date: 10/10/2018 CLINICAL DATA:  Left shoulder pain for 1 month without known injury. EXAM: LEFT SHOULDER - 2+ VIEW COMPARISON:  None. FINDINGS: There is no evidence of fracture or dislocation. There is no evidence of arthropathy. Os acromiale is noted which is developmental variant. Soft tissues are unremarkable. IMPRESSION: No significant abnormality seen in the left shoulder. Electronically Signed   By: Lupita Raider M.D.   On: 10/10/2018 09:30    Procedures Procedures (including critical care time)  Medications Ordered in ED Medications - No data to display   Initial Impression / Assessment and Plan / ED Course  I have reviewed the triage vital signs and the nursing notes.  Pertinent labs & imaging results that were available during my care of the patient were reviewed by me and considered in my medical decision making (see chart for details).    Patient with no pertinent PMH presents to the ED with a chief complaint of left shoulder pain x 1 month. Reports she has been lifting heavy cases at [employer] where she is employed. Has tried some advil with mild relieve in symptoms along with muscle cream which helped her pain. She denies any fever, no swelling noted to the arm, low suspicion for septic joint. Neuro exam is unremarkable, pain is reproducible with palpation. EKG obtained which was NSR, no changes consistent with infarct or STEMI. Vitals are within normal limits, no tachycardia or hypoxia.   Will obtain DG left shoulder to r/o any acute process although unlikely as patient has  full ROM.  9:52 AM I have personally reviewed patient's xray which showed no acute fracture or dislocation. I have discussed these results with patient. At this time suspicion that likely patient strained from lifting heavy boxes, will treat with short course of muscle relaxers along with cryotherapy. She is advised to return if she experiences any fever, chest pain, worsening symptoms. Patient understands and agrees with plan at this time. Return precautions discussed at length.      Portions of this note were generated with Scientist, clinical (histocompatibility and immunogenetics). Dictation errors may occur despite best attempts at proofreading.    Final Clinical Impressions(s) / ED Diagnoses   Final diagnoses:  Acute pain of left shoulder    ED Discharge Orders         Ordered    methocarbamol (ROBAXIN) 500 MG tablet  2 times daily     10/10/18 0951    naproxen (NAPROSYN) 500 MG tablet  2 times daily     10/10/18 0951           Claude Manges, PA-C 10/10/18 1000    Claude Manges, PA-C 10/10/18 1003    Jacalyn Lefevre, MD 10/10/18 1050

## 2019-09-01 ENCOUNTER — Emergency Department (HOSPITAL_COMMUNITY)
Admission: EM | Admit: 2019-09-01 | Discharge: 2019-09-01 | Disposition: A | Discharge status: Home / Self Care | Payer: BC Managed Care – PPO | Attending: Emergency Medicine | Admitting: Emergency Medicine

## 2019-09-01 ENCOUNTER — Other Ambulatory Visit: Payer: Self-pay

## 2019-09-01 DIAGNOSIS — H1089 Other conjunctivitis: Secondary | ICD-10-CM | POA: Insufficient documentation

## 2019-09-01 DIAGNOSIS — H1031 Unspecified acute conjunctivitis, right eye: Secondary | ICD-10-CM

## 2019-09-01 DIAGNOSIS — H5711 Ocular pain, right eye: Secondary | ICD-10-CM | POA: Diagnosis present

## 2019-09-01 MED ORDER — OFLOXACIN 0.3 % OP SOLN
2.0000 [drp] | Freq: Once | OPHTHALMIC | Status: AC
  Administered 2019-09-01: 2 [drp] via OPHTHALMIC
  Filled 2019-09-01: qty 5

## 2019-09-01 NOTE — ED Triage Notes (Addendum)
PT to ED by POV with c/o of eye pain, redness, and draining clear liquid. Pt pupils are reactive and equal. Pt states pain in eye is 5/10 with light sensitivity.

## 2019-09-01 NOTE — ED Provider Notes (Signed)
[place] DEPT Provider Note   CSN: [id number] Arrival date & time: 09/01/19  0530     History Chief Complaint  Patient presents with  . Eye Pain    Valerie Hancock is a 29 y.o. female.  Valerie Hancock is a 29 y.o. female with obesity and Bell's palsy, who presents to the emergency department for evaluation of right eye pain, redness and drainage.  She states that she usually wears colored contact lenses and yesterday at work her supervisor noticed that her right eye was starting to become red, as the day went on the eye started to drain and became increasingly painful and sensitive to light.  She states that she went home from work and took out her contact but it has continued to be red, irritated and draining white fluid.  She denies history of similar eye infections, denies any similar symptoms in the left eye, she is taken out her contacts and symptoms worsened yesterday at work.  She denies changes in vision just light sensitivity.  Denies swelling or pain around the eye, no fevers or chills.  Contacts are nonprescription.  No other aggravating or alleviating factors.        Past Medical History:  Diagnosis Date  . Bell palsy     Patient Active Problem List   Diagnosis Date Noted  . VAGINAL DISCHARGE 07/21/2010  . DYSURIA 12/04/2009  . OTHER NONSPECIFIC FINDING EXAMINATION OF URINE 12/04/2009  . BREAST CYST, RIGHT 11/04/2009  . BELLS PALSY 02/21/2009  . DERMATOPHYTOSIS OF FOOT 01/01/2009  . OBESITY 01/01/2009    No past surgical history on file.   OB History    Gravida  1   Para      Term      Preterm      AB      Living        SAB      TAB      Ectopic      Multiple      Live Births              No family history on file.  Social History   Tobacco Use  . Smoking status: Never Smoker  . Smokeless tobacco: Never Used  Substance Use Topics  . Alcohol use: Yes    Comment: occasionally  . Drug use: No      Home Medications Prior to Admission medications   Medication Sig Start Date End Date Taking? Authorizing Provider  ibuprofen (ADVIL,MOTRIN) 200 MG tablet Take 400-800 mg by mouth every 6 (six) hours as needed for headache, mild pain or cramping.     [provider]  Menthol-Methyl Salicylate (MUSCLE RUB) 10-15 % CREA Apply 1 application topically as needed for muscle pain (shoulder).    [provider]  terbinafine (LAMISIL) 250 MG tablet Take 1 tablet (250 mg total) by mouth daily. Patient not taking: Reported on 10/10/2018 12/18/15   Wallene Huh, DPM    Allergies    Patient has no known allergies.  Review of Systems   Review of Systems  Constitutional: Negative for chills and fever.  HENT: Negative for congestion, rhinorrhea and sore throat.   Eyes: Positive for photophobia, pain, discharge and redness. Negative for itching and visual disturbance.  Skin: Negative for color change and rash.  Neurological: Negative for headaches.    Physical Exam Updated Vital Signs BP (!) 141/92 (BP Location: Left Arm)   Pulse 76   Temp 98.4  F (36.9 C) (Oral)   Resp 15   Ht 5\' 7"  (1.702 m)   Wt 97.5 kg   SpO2 99%   BMI 33.67 kg/m   Physical Exam Vitals and nursing note reviewed.  Constitutional:      General: She is not in acute distress.    Appearance: Normal appearance. She is well-developed and normal weight. She is not ill-appearing or diaphoretic.  HENT:     Head: Normocephalic and atraumatic.  Eyes:     General:        Right eye: Discharge present.        Left eye: No discharge.     Comments: Right eye with conjunctival erythema and small amount of white purulent drainage noted, no evidence of corneal lesions, EOMI, PERRLA, no periorbital swelling or proptosis.  Contact lens has been removed  Left eye with colored contact present but no surrounding erythema or purulent drainage, EOMI.  PERRLA.  Pulmonary:     Effort: Pulmonary effort is normal. No  respiratory distress.  Musculoskeletal:     Cervical back: Neck supple.  Skin:    General: Skin is warm and dry.  Neurological:     Mental Status: She is alert and oriented to person, place, and time.     Coordination: Coordination normal.  Psychiatric:        Mood and Affect: Mood normal.        Behavior: Behavior normal.     ED Results / Procedures / Treatments   Labs (all labs ordered are listed, but only abnormal results are displayed) Labs Reviewed - No data to display  EKG None  Radiology No results found.  Procedures Procedures (including critical care time)  Medications Ordered in ED Medications  ofloxacin (OCUFLOX) 0.3 % ophthalmic solution 2 drop (has no administration in time range)    ED Course  I have reviewed the triage vital signs and the nursing notes.  Pertinent labs & imaging results that were available during my care of the patient were reviewed by me and considered in my medical decision making (see chart for details).    MDM Rules/Calculators/A&P                      MONICIA TSE presents with symptoms consistent with bacterial conjunctivitis.  Purulent discharge exam. Presentation non-concerning for iritis, corneal abrasions, or HSV.  No evidence of preseptal or orbital cellulitis.  Pt is a contact lens wearer.  Patient will be ofloxacin ophthalmic drops. Patient advised to stop wearing contacts for at least 2 weeks. Personal hygiene and frequent handwashing discussed.  Patient advised to followup with ophthalmologist for reevaluation in several days..  Patient verbalizes understanding and is agreeable with discharge.   Final Clinical Impression(s) / ED Diagnoses Final diagnoses:  Acute bacterial conjunctivitis of right eye    Rx / DC Orders ED Discharge Orders    None       Vonzell Schlatter 09/01/19 09/03/19    [id number], MD 09/01/19 2239

## 2019-09-01 NOTE — Discharge Instructions (Signed)
You have conjunctivitis (Pink eye), read information provided.  Please use Ofloxacin eye drops provided to you today 2 drops every 4 hours for 7 days  Stop wearing your contacts for at least 2 weeks  Pink eye is very contagious, make sure you wash your hands frequently and avoid touching your other eye, if you start developing symptoms in your left eye, begin using antibiotic drop in that eye as well.

## 2019-09-05 ENCOUNTER — Other Ambulatory Visit: Payer: Self-pay | Admitting: Podiatry

## 2019-09-05 ENCOUNTER — Ambulatory Visit (INDEPENDENT_AMBULATORY_CARE_PROVIDER_SITE_OTHER): Payer: BC Managed Care – PPO

## 2019-09-05 ENCOUNTER — Ambulatory Visit (INDEPENDENT_AMBULATORY_CARE_PROVIDER_SITE_OTHER): Payer: BC Managed Care – PPO | Admitting: Podiatry

## 2019-09-05 ENCOUNTER — Other Ambulatory Visit: Payer: Self-pay

## 2019-09-05 DIAGNOSIS — M795 Residual foreign body in soft tissue: Secondary | ICD-10-CM | POA: Diagnosis not present

## 2019-09-05 DIAGNOSIS — M79671 Pain in right foot: Secondary | ICD-10-CM

## 2019-09-05 DIAGNOSIS — L989 Disorder of the skin and subcutaneous tissue, unspecified: Secondary | ICD-10-CM | POA: Diagnosis not present

## 2019-09-09 NOTE — Progress Notes (Signed)
   Subjective: 29 y.o. female presenting to the office today as a new patient with a chief complaint of a painful lesion noted to the right great toe that appeared a few months ago. She reports an initial injury to the toe about ten years ago when she stepped on a wooden skewer. She states now a painful callus has developed. She has not had any treatment for the symptoms. Touching the area increases the pain. Patient is here for further evaluation and treatment.   Past Medical History:  Diagnosis Date  . Bell palsy      Objective:  Physical Exam General: Alert and oriented x3 in no acute distress  Dermatology: Hyperkeratotic lesion(s) present on the right great toe. Pain on palpation with a central nucleated core noted. Skin is warm, dry and supple bilateral lower extremities. Negative for open lesions or macerations.  Vascular: Palpable pedal pulses bilaterally. No edema or erythema noted. Capillary refill within normal limits.  Neurological: Epicritic and protective threshold grossly intact bilaterally.   Musculoskeletal Exam: Pain on palpation at the keratotic lesion(s) noted. Range of motion within normal limits bilateral. Muscle strength 5/5 in all groups bilateral.  Radiographic Exam:  Normal osseous mineralization. Joint spaces preserved. No fracture/dislocation/boney destruction.    Assessment: 1. Pre-ulcerative callus lesion noted to the right hallux    Plan of Care:  1. Patient evaluated 2. Excisional debridement of keratoic lesion(s) using a chisel blade was performed without incident.  3. Dressed area with light dressing. 4. Recommended Urea 40% lotion.  5. Recommended good shoe gear.  6. Patient is to return to the clinic PRN.   Works at [employer].    Felecia Shelling, DPM [facility]  Dr. Felecia Shelling, DPM    [address]                                        [place], [place] [address]                Office [phone]  Fax [phone]   '

## 2020-10-27 ENCOUNTER — Ambulatory Visit (INDEPENDENT_AMBULATORY_CARE_PROVIDER_SITE_OTHER): Payer: BC Managed Care – PPO | Admitting: Podiatry

## 2020-10-27 ENCOUNTER — Other Ambulatory Visit: Payer: Self-pay

## 2020-10-27 DIAGNOSIS — L989 Disorder of the skin and subcutaneous tissue, unspecified: Secondary | ICD-10-CM | POA: Diagnosis not present

## 2020-10-27 NOTE — Progress Notes (Signed)
   Subjective: 30 y.o. female presenting to the office today for follow-up evaluation regarding a chief complaint of a painful lesion noted to the right great toe. She reports an initial injury to the toe about ten years ago when she stepped on a wooden skewer. She states now a painful callus has developed.   Last visit on 09/05/2019 the lesion was debrided and lotion was applied.  She feels much better for several months after.  Slowly the callus lesion has recurred.  She has been applying the provided arm lotion as instructed.  She states that now she is beginning to develop symptomatic callus to the left great toe as well.   Past Medical History:  Diagnosis Date  . Bell palsy      Objective:  Physical Exam General: Alert and oriented x3 in no acute distress  Dermatology: Hyperkeratotic lesion(s) present on the bilateral great toes.  Pain on palpation with a central nucleated core noted. Skin is warm, dry and supple bilateral lower extremities. Negative for open lesions or macerations.  Vascular: Palpable pedal pulses bilaterally. No edema or erythema noted. Capillary refill within normal limits.  Neurological: Epicritic and protective threshold grossly intact bilaterally.   Musculoskeletal Exam: Pain on palpation at the keratotic lesion(s) noted. Range of motion within normal limits bilateral. Muscle strength 5/5 in all groups bilateral.  Assessment: 1. Pre-ulcerative callus lesion noted to the bilateral great toes   Plan of Care:  1. Patient evaluated 2. Excisional debridement of keratoic lesion(s) using a chisel blade was performed without incident.  3. Dressed area with light dressing. 4.  Continue urea 40% lotion.  5. Recommended good shoe gear.  6. Patient is to return to the clinic PRN.   Works at [employer].  Trying to save money for house   Felecia Shelling, DPM [facility]  Dr. Felecia Shelling, DPM    [address] [place], [place] [address]                Office [phone]  Fax [phone]   '

## 2021-06-22 ENCOUNTER — Emergency Department (HOSPITAL_COMMUNITY)
Admission: EM | Admit: 2021-06-22 | Discharge: 2021-06-22 | Disposition: A | Discharge status: Home / Self Care | Payer: BC Managed Care – PPO | Attending: Emergency Medicine | Admitting: Emergency Medicine

## 2021-06-22 ENCOUNTER — Other Ambulatory Visit: Payer: Self-pay

## 2021-06-22 ENCOUNTER — Encounter (HOSPITAL_COMMUNITY): Payer: Self-pay

## 2021-06-22 DIAGNOSIS — Z3A01 Less than 8 weeks gestation of pregnancy: Secondary | ICD-10-CM | POA: Insufficient documentation

## 2021-06-22 DIAGNOSIS — Z20822 Contact with and (suspected) exposure to covid-19: Secondary | ICD-10-CM | POA: Diagnosis not present

## 2021-06-22 DIAGNOSIS — O26891 Other specified pregnancy related conditions, first trimester: Secondary | ICD-10-CM | POA: Diagnosis not present

## 2021-06-22 DIAGNOSIS — M791 Myalgia, unspecified site: Secondary | ICD-10-CM | POA: Insufficient documentation

## 2021-06-22 DIAGNOSIS — O219 Vomiting of pregnancy, unspecified: Secondary | ICD-10-CM | POA: Insufficient documentation

## 2021-06-22 DIAGNOSIS — R509 Fever, unspecified: Secondary | ICD-10-CM | POA: Insufficient documentation

## 2021-06-22 DIAGNOSIS — R112 Nausea with vomiting, unspecified: Secondary | ICD-10-CM

## 2021-06-22 LAB — COMPREHENSIVE METABOLIC PANEL
ALT: 12 U/L (ref 0–44)
AST: 16 U/L (ref 15–41)
Albumin: 4.1 g/dL (ref 3.5–5.0)
Alkaline Phosphatase: 42 U/L (ref 38–126)
Anion gap: 9 (ref 5–15)
BUN: 12 mg/dL (ref 6–20)
CO2: 22 mmol/L (ref 22–32)
Calcium: 9 mg/dL (ref 8.9–10.3)
Chloride: 103 mmol/L (ref 98–111)
Creatinine, Ser: 0.66 mg/dL (ref 0.44–1.00)
GFR, Estimated: >60 mL/min (ref >60)
Glucose, Bld: 102 mg/dL — ABNORMAL HIGH (ref 70–99)
Potassium: 3.6 mmol/L (ref 3.5–5.1)
Sodium: 134 mmol/L — ABNORMAL LOW (ref 135–145)
Total Bilirubin: 0.9 mg/dL (ref 0.3–1.2)
Total Protein: 7.7 g/dL (ref 6.5–8.1)

## 2021-06-22 LAB — CBC WITH DIFFERENTIAL/PLATELET
Abs Immature Granulocytes: 0.04 10*3/uL (ref 0.00–0.07)
Basophils Absolute: 0 10*3/uL (ref 0.0–0.1)
Basophils Relative: 0 %
Eosinophils Absolute: 0 10*3/uL (ref 0.0–0.5)
Eosinophils Relative: 0 %
HCT: 38.9 % (ref 36.0–46.0)
Hemoglobin: 13.2 g/dL (ref 12.0–15.0)
Immature Granulocytes: 0 %
Lymphocytes Relative: 23 %
Lymphs Abs: 2.4 10*3/uL (ref 0.7–4.0)
MCH: 30.3 pg (ref 26.0–34.0)
MCHC: 33.9 g/dL (ref 30.0–36.0)
MCV: 89.4 fL (ref 80.0–100.0)
Monocytes Absolute: 0.7 10*3/uL (ref 0.1–1.0)
Monocytes Relative: 7 %
Neutro Abs: 7.2 10*3/uL (ref 1.7–7.7)
Neutrophils Relative %: 70 %
Platelets: 265 10*3/uL (ref 150–400)
RBC: 4.35 MIL/uL (ref 3.87–5.11)
RDW: 13 % (ref 11.5–15.5)
WBC: 10.4 10*3/uL (ref 4.0–10.5)
nRBC: 0 % (ref 0.0–0.2)

## 2021-06-22 LAB — RESP PANEL BY RT-PCR (FLU A&B, COVID) ARPGX2
Influenza A by PCR: NEGATIVE
Influenza B by PCR: NEGATIVE
SARS Coronavirus 2 by RT PCR: NEGATIVE

## 2021-06-22 LAB — HCG, QUANTITATIVE, PREGNANCY: hCG, Beta Chain, Quant, S: 56479 m[IU]/mL — ABNORMAL HIGH (ref <5)

## 2021-06-22 LAB — MAGNESIUM: Magnesium: 2 mg/dL (ref 1.7–2.4)

## 2021-06-22 LAB — LIPASE, BLOOD: Lipase: 24 U/L (ref 11–51)

## 2021-06-22 MED ORDER — ACETAMINOPHEN 500 MG PO TABS
1000.0000 mg | ORAL_TABLET | Freq: Once | ORAL | Status: AC
  Administered 2021-06-22: 1000 mg via ORAL
  Filled 2021-06-22: qty 2

## 2021-06-22 MED ORDER — METOCLOPRAMIDE HCL 5 MG/ML IJ SOLN
10.0000 mg | Freq: Once | INTRAMUSCULAR | Status: AC
  Administered 2021-06-22: 10 mg via INTRAVENOUS
  Filled 2021-06-22: qty 2

## 2021-06-22 MED ORDER — DIPHENHYDRAMINE HCL 50 MG/ML IJ SOLN
25.0000 mg | Freq: Once | INTRAMUSCULAR | Status: AC
  Administered 2021-06-22: 25 mg via INTRAVENOUS
  Filled 2021-06-22: qty 1

## 2021-06-22 MED ORDER — LACTATED RINGERS IV BOLUS
1000.0000 mL | Freq: Once | INTRAVENOUS | Status: AC
  Administered 2021-06-22: 1000 mL via INTRAVENOUS

## 2021-06-22 MED ORDER — ONDANSETRON HCL 4 MG/2ML IJ SOLN
4.0000 mg | Freq: Once | INTRAMUSCULAR | Status: AC
  Administered 2021-06-22: 4 mg via INTRAVENOUS
  Filled 2021-06-22: qty 2

## 2021-06-22 NOTE — ED Provider Notes (Signed)
Results for orders placed or performed during the hospital encounter of 06/22/21  Resp Panel by RT-PCR (Flu A&B, Covid) Nasopharyngeal Swab   Specimen: Nasopharyngeal Swab; Nasopharyngeal(NP) swabs in vial transport medium  Result Value Ref Range   SARS Coronavirus 2 by RT PCR NEGATIVE NEGATIVE   Influenza A by PCR NEGATIVE NEGATIVE   Influenza B by PCR NEGATIVE NEGATIVE  Comprehensive metabolic panel  Result Value Ref Range   Sodium 134 (L) 135 - 145 mmol/L   Potassium 3.6 3.5 - 5.1 mmol/L   Chloride 103 98 - 111 mmol/L   CO2 22 22 - 32 mmol/L   Glucose, Bld 102 (H) 70 - 99 mg/dL   BUN 12 6 - 20 mg/dL   Creatinine, Ser 6.44 0.44 - 1.00 mg/dL   Calcium 9.0 8.9 - 03.4 mg/dL   Total Protein 7.7 6.5 - 8.1 g/dL   Albumin 4.1 3.5 - 5.0 g/dL   AST 16 15 - 41 U/L   ALT 12 0 - 44 U/L   Alkaline Phosphatase 42 38 - 126 U/L   Total Bilirubin 0.9 0.3 - 1.2 mg/dL   GFR, Estimated >74 >25 mL/min   Anion gap 9 5 - 15  CBC with Differential  Result Value Ref Range   WBC 10.4 4.0 - 10.5 K/uL   RBC 4.35 3.87 - 5.11 MIL/uL   Hemoglobin 13.2 12.0 - 15.0 g/dL   HCT 95.6 38.7 - 56.4 %   MCV 89.4 80.0 - 100.0 fL   MCH 30.3 26.0 - 34.0 pg   MCHC 33.9 30.0 - 36.0 g/dL   RDW 33.2 95.1 - 88.4 %   Platelets 265 150 - 400 K/uL   nRBC 0.0 0.0 - 0.2 %   Neutrophils Relative % 70 %   Neutro Abs 7.2 1.7 - 7.7 K/uL   Lymphocytes Relative 23 %   Lymphs Abs 2.4 0.7 - 4.0 K/uL   Monocytes Relative 7 %   Monocytes Absolute 0.7 0.1 - 1.0 K/uL   Eosinophils Relative 0 %   Eosinophils Absolute 0.0 0.0 - 0.5 K/uL   Basophils Relative 0 %   Basophils Absolute 0.0 0.0 - 0.1 K/uL   Immature Granulocytes 0 %   Abs Immature Granulocytes 0.04 0.00 - 0.07 K/uL  Lipase, blood  Result Value Ref Range   Lipase 24 11 - 51 U/L  Magnesium  Result Value Ref Range   Magnesium 2.0 1.7 - 2.4 mg/dL  hCG, quantitative, pregnancy  Result Value Ref Range   hCG, Beta Chain, Quant, S 56,479 (H) <5 mIU/mL   No results  found.  Pt reports feeling better after reglan and benadryl.  Pt counseled on lab results.  Pt advised follow up for pregnancy.  Headache resolved  An After Visit Summary was printed and given to the patient.     Elson Areas, PA-C 06/22/21 1431    Horton, Clabe Seal, DO 06/22/21 1455

## 2021-06-22 NOTE — ED Provider Triage Note (Signed)
Emergency Medicine Provider Triage Evaluation Note  Valerie Hancock , a 31 y.o. female  was evaluated in triage.  Pt complains of nausea and vomiting.  Patient states about 3 days ago she began experiencing intermittent fevers, body aches, headaches, as well as nausea and vomiting.  She states that she has been unable to tolerate any p.o. intake due to her symptoms.  Reports fatigue.  No shortness of breath or abdominal pain.  Physical Exam  BP (!) 144/98    Pulse 100    Temp 99.1 F (37.3 C) (Oral)    Resp 18    Ht 5\' 7"  (1.702 m)    Wt 93.4 kg    SpO2 95%    BMI 32.26 kg/m  Gen:   Awake, no distress   Resp:  Normal effort  MSK:   Moves extremities without difficulty  Other:    Medical Decision Making  Medically screening exam initiated at 5:57 AM.  Appropriate orders placed.  SYMBA RAMSDELL was informed that the remainder of the evaluation will be completed by another provider, this initial triage assessment does not replace that evaluation, and the importance of remaining in the ED until their evaluation is complete.   Rayna Sexton, PA-C 06/22/21 [phone]

## 2021-06-22 NOTE — ED Triage Notes (Signed)
Pt reports with headache, vomiting, generalized body aches, and fever since Saturday.

## 2021-06-23 ENCOUNTER — Inpatient Hospital Stay (HOSPITAL_COMMUNITY)
Admission: AD | Admit: 2021-06-23 | Discharge: 2021-06-23 | Disposition: A | Discharge status: Home / Self Care | Payer: BC Managed Care – PPO | Attending: Obstetrics & Gynecology | Admitting: Obstetrics & Gynecology

## 2021-06-23 ENCOUNTER — Other Ambulatory Visit: Payer: Self-pay

## 2021-06-23 ENCOUNTER — Encounter (HOSPITAL_COMMUNITY): Payer: Self-pay | Admitting: *Deleted

## 2021-06-23 DIAGNOSIS — R519 Headache, unspecified: Secondary | ICD-10-CM | POA: Diagnosis not present

## 2021-06-23 DIAGNOSIS — O219 Vomiting of pregnancy, unspecified: Secondary | ICD-10-CM | POA: Diagnosis not present

## 2021-06-23 DIAGNOSIS — O26891 Other specified pregnancy related conditions, first trimester: Secondary | ICD-10-CM | POA: Diagnosis not present

## 2021-06-23 DIAGNOSIS — Z3A08 8 weeks gestation of pregnancy: Secondary | ICD-10-CM | POA: Diagnosis not present

## 2021-06-23 LAB — URINALYSIS, ROUTINE W REFLEX MICROSCOPIC
Glucose, UA: NEGATIVE mg/dL
Hgb urine dipstick: NEGATIVE
Ketones, ur: >80 mg/dL — AB
Leukocytes,Ua: NEGATIVE
Nitrite: NEGATIVE
Protein, ur: NEGATIVE mg/dL
Specific Gravity, Urine: >1.03 — ABNORMAL HIGH (ref 1.005–1.030)
pH: 6 (ref 5.0–8.0)

## 2021-06-23 MED ORDER — FAMOTIDINE IN NACL 20-0.9 MG/50ML-% IV SOLN
20.0000 mg | Freq: Once | INTRAVENOUS | Status: AC
Start: 2021-06-23 — End: 2021-06-23
  Administered 2021-06-23: 20 mg via INTRAVENOUS
  Filled 2021-06-23: qty 50

## 2021-06-23 MED ORDER — METOCLOPRAMIDE HCL 5 MG/ML IJ SOLN
10.0000 mg | Freq: Once | INTRAMUSCULAR | Status: AC
  Administered 2021-06-23: 10 mg via INTRAVENOUS
  Filled 2021-06-23: qty 2

## 2021-06-23 MED ORDER — LACTATED RINGERS IV BOLUS
1000.0000 mL | Freq: Once | INTRAVENOUS | Status: AC
  Administered 2021-06-23: 1000 mL via INTRAVENOUS

## 2021-06-23 MED ORDER — METOCLOPRAMIDE HCL 10 MG PO TABS: 10.0000 mg | ORAL_TABLET | Freq: Three times a day (TID) | ORAL | 0 refills | Status: DC | PRN

## 2021-06-23 NOTE — MAU Provider Note (Signed)
History     CSN: [id number]  Arrival date and time: 06/23/21 1636   Event Date/Time   First Provider Initiated Contact with Patient 06/23/21 1742      Chief Complaint  Patient presents with   Emesis   Nausea   Headache   HPI Valerie Hancock is a 31 y.o. G2P0010 at [redacted]w[redacted]d who presents with nausea, vomiting, and headache. Reports symptoms started 2 days ago. Found out she was pregnant yesterday in the ED. Symptoms improved with meds given in the ED but wasn't prescribed antiemetic. Symptoms returned today.  States when she eats, she vomits 1-2 hours later. Has a headache that she rates 5/10. Hasn't treated symptoms.  Denies fever, diarrhea, abdominal pain, or vaginal bleeding.   OB History     Gravida  2   Para      Term      Preterm      AB  1   Living         SAB      IAB  1   Ectopic      Multiple      Live Births              Past Medical History:  Diagnosis Date   Bell palsy     History reviewed. No pertinent surgical history.  History reviewed. No pertinent family history.  Social History   Tobacco Use   Smoking status: Never   Smokeless tobacco: Never  Vaping Use   Vaping Use: Never used  Substance Use Topics   Alcohol use: Not Currently    Comment: occasionally   Drug use: No    Allergies: No Known Allergies  Medications Prior to Admission  Medication Sig Dispense Refill Last Dose   ibuprofen (ADVIL,MOTRIN) 200 MG tablet Take 400-800 mg by mouth every 6 (six) hours as needed for headache, mild pain or cramping.        Review of Systems  Constitutional: Negative.   Eyes:  Negative for visual disturbance.  Gastrointestinal:  Positive for nausea and vomiting. Negative for abdominal pain and diarrhea.  Genitourinary: Negative.   Neurological:  Positive for headaches.  Physical Exam   Blood pressure 134/80, pulse 76, temperature 98.1 F (36.7 C), temperature source Oral, resp. rate 18, height 5\' 7"  (1.702 m), weight 91.4 kg,  last menstrual period 04/28/2021, SpO2 100 %, unknown if currently breastfeeding.  Physical Exam Vitals and nursing note reviewed.  Constitutional:      Appearance: She is well-developed. She is not ill-appearing.  HENT:     Head: Normocephalic and atraumatic.  Eyes:     General: No scleral icterus.    Extraocular Movements: Extraocular movements intact.  Pulmonary:     Effort: Pulmonary effort is normal. No respiratory distress.  Neurological:     Mental Status: She is alert.  Psychiatric:        Mood and Affect: Mood normal.        Behavior: Behavior normal.    MAU Course  Procedures Results for orders placed or performed during the hospital encounter of 06/23/21 (from the past 24 hour(s))  Urinalysis, Routine w reflex microscopic Urine, Clean Catch     Status: Abnormal   Collection Time: 06/23/21  5:23 PM  Result Value Ref Range   Color, Urine YELLOW YELLOW   APPearance CLEAR CLEAR   Specific Gravity, Urine >1.030 (H) 1.005 - 1.030   pH 6.0 5.0 - 8.0   Glucose, UA NEGATIVE NEGATIVE mg/dL  Hgb urine dipstick NEGATIVE NEGATIVE   Bilirubin Urine SMALL (A) NEGATIVE   Ketones, ur >80 (A) NEGATIVE mg/dL   Protein, ur NEGATIVE NEGATIVE mg/dL   Nitrite NEGATIVE NEGATIVE   Leukocytes,Ua NEGATIVE NEGATIVE    MDM Patient presents with n/v & headache. No abdominal pain or vaginal bleeding.  Given IV fluids, reglan, & pepcid. Able to keep down crackers & drink. Reports resolution of headache.   Assessment and Plan   1. Nausea and vomiting during pregnancy prior to [redacted] weeks gestation   2. [redacted] weeks gestation of pregnancy    -Rx reglan prn n/v or headache -start prenatal care  Jorje Guild 06/23/2021, 5:42 PM

## 2021-06-23 NOTE — Discharge Instructions (Signed)
  [facility] Providers          [facility]  [address], [place], [place] [address]  [phone]  [facility]  [address], [place], [place] [address]  [phone]  [facility]  [address], [place], [place] [address]  [phone]  [facility] [place]  [address], [place], [place] [address]  [phone]  [facility]  [address] floor), [place], [place] [address]  [phone]  [facility] [address], [place], [place] [address] [phone]  [facility]  [address], [place], [place] [address]  [phone]  [facility]  [address], [place], [place] [address]  [phone]  [facility]  [address], [place], [place] [address]  [phone]  [facility]  [address], [place], [place] [address]  [phone]  [facility]  [address], [place], [place] [address]  [phone]  [facility]  [address], [place], [place] [address]  [phone]  [facility]   [address], [place], [place] [address]  [phone]  [facility]  [address], [place],  [address]   [phone]  [facility]  [address], [place],  [address]  [phone]         

## 2021-06-23 NOTE — MAU Note (Signed)
Presents with c/o N/V and H/A.  Reports found out she was pregnant yesterday @ [facility] ED, reports went for there for N/V because the wanted to be tested for Covid.   Reports hasn't kept any food or fluids down for past 2 days.  Given meds @ ED that initially worked.  LMP Novemeber 2022, prior to Thanksgiving.  Denies VB.

## 2021-07-09 NOTE — ED Provider Notes (Signed)
[facility]-EMERGENCY DEPT Provider Note   CSN: [id number] Arrival date & time: 06/22/21  0538     History  Chief Complaint  Patient presents with   Headache   Vomiting   Generalized Body Aches   Fever    Valerie Hancock is a 31 y.o. female.  The history is provided by the patient. No language interpreter was used.  Headache Pain location:  Generalized Onset quality:  Gradual Duration:  2 days Timing:  Constant Progression:  Worsening Chronicity:  New Similar to prior headaches: yes   Relieved by:  Nothing Ineffective treatments:  None tried Associated symptoms: fever   Associated symptoms: no abdominal pain   Fever Associated symptoms: headaches       Home Medications Prior to Admission medications   Medication Sig Start Date End Date Taking? Authorizing Provider  metoCLOPramide (REGLAN) 10 MG tablet Take 1 tablet (10 mg total) by mouth every 8 (eight) hours as needed for nausea. 06/23/21   Judeth Horn, NP      Allergies    Patient has no known allergies.    Review of Systems   Review of Systems  Constitutional:  Positive for fever.  Gastrointestinal:  Negative for abdominal pain.  Neurological:  Positive for headaches.  All other systems reviewed and are negative.  Physical Exam Updated Vital Signs BP 117/70    Pulse 80    Temp 99.1 F (37.3 C) (Oral)    Resp 18    Ht 5\' 7"  (1.702 m)    Wt 93.4 kg    SpO2 99%    BMI 32.26 kg/m  Physical Exam Vitals and nursing note reviewed.  Constitutional:      Appearance: She is well-developed.  HENT:     Head: Normocephalic.  Cardiovascular:     Rate and Rhythm: Normal rate and regular rhythm.  Pulmonary:     Effort: Pulmonary effort is normal.  Abdominal:     General: There is no distension.  Musculoskeletal:        General: Normal range of motion.     Cervical back: Normal range of motion.  Neurological:     Mental Status: She is alert and oriented to person, place, and time.      Cranial Nerves: No cranial nerve deficit or dysarthria.     Motor: No weakness.  Psychiatric:        Mood and Affect: Mood normal.        Behavior: Behavior normal.    ED Results / Procedures / Treatments   Labs (all labs ordered are listed, but only abnormal results are displayed) Labs Reviewed  COMPREHENSIVE METABOLIC PANEL - Abnormal; Notable for the following components:      Result Value   Sodium 134 (*)    Glucose, Bld 102 (*)    All other components within normal limits  HCG, QUANTITATIVE, PREGNANCY - Abnormal; Notable for the following components:   hCG, Beta Chain, Quant, S 56,479 (*)    All other components within normal limits  RESP PANEL BY RT-PCR (FLU A&B, COVID) ARPGX2  CBC WITH DIFFERENTIAL/PLATELET  LIPASE, BLOOD  MAGNESIUM    EKG None  Radiology No results found.  Procedures Procedures    Medications Ordered in ED Medications  lactated ringers bolus 1,000 mL (0 mLs Intravenous Stopped 06/22/21 0649)  ondansetron (ZOFRAN) injection 4 mg (4 mg Intravenous Given 06/22/21 0607)  metoCLOPramide (REGLAN) injection 10 mg (10 mg Intravenous Given 06/22/21 0649)  diphenhydrAMINE (BENADRYL) injection 25  mg (25 mg Intravenous Given 06/22/21 0649)  acetaminophen (TYLENOL) tablet 1,000 mg (1,000 mg Oral Given 06/22/21 2725)    ED Course/ Medical Decision Making/ A&P                           Medical Decision Making Amount and/or Complexity of Data Reviewed Labs: ordered.  Risk OTC drugs. Prescription drug management.   MDM:  Pt given Iv fluids reglan and benadryl.  Pt reports headache has resolved and she feels much better.  Pt has a positive pregnancy test.  Pt has no abdominal pain.  Pt advised to follow up for prenatal care        Final Clinical Impression(s) / ED Diagnoses Final diagnoses:  Less than [redacted] weeks gestation of pregnancy  Nausea and vomiting, unspecified vomiting type    Rx / DC Orders ED Discharge Orders     None         Elson Areas, PA-C 07/09/21 1712    Rozelle Logan, DO 07/11/21 1545

## 2022-10-03 ENCOUNTER — Ambulatory Visit (HOSPITAL_COMMUNITY)
Admission: EM | Admit: 2022-10-03 | Discharge: 2022-10-03 | Disposition: A | Discharge status: Home / Self Care | Payer: BC Managed Care – PPO | Attending: Family Medicine | Admitting: Family Medicine

## 2022-10-03 ENCOUNTER — Encounter (HOSPITAL_COMMUNITY): Payer: Self-pay

## 2022-10-03 DIAGNOSIS — G510 Bell's palsy: Secondary | ICD-10-CM

## 2022-10-03 MED ORDER — PREDNISONE 20 MG PO TABS: 60.0000 mg | ORAL_TABLET | Freq: Every day | ORAL | 0 refills | Status: AC

## 2022-10-03 MED ORDER — VALACYCLOVIR HCL 1 G PO TABS: 1000.0000 mg | ORAL_TABLET | Freq: Three times a day (TID) | ORAL | 0 refills | Status: AC

## 2022-10-03 NOTE — Discharge Instructions (Signed)
Take prednisone 20 mg--3 daily for 7 days  Take valacyclovir 1000 mg--1 tablet 3 times daily for 7 days  Use artificial tears eyedrops to lubricate your left eye during the day, and then use a lubricating eye ointment in your left eye at night and take it at night.  You can use the QR code/website at the back of the summary paperwork to schedule yourself a new patient appointment with primary care

## 2022-10-03 NOTE — ED Provider Notes (Signed)
[facility]    CSN: [id number] Arrival date & time: 10/03/22  1016      History   Chief Complaint Chief Complaint  Patient presents with   Facial Swelling    HPI Valerie Hancock is a 32 y.o. female.   HPI here for left-sided headache and now left facial numbness and left facial droop.  She states that about 1 week ago she began having "just a normal headache".  Then about 3 days ago the pain became more persistent in her left occiput and then started hurting more toward her left ear.  No fever or chills and no congestion.  Then yesterday she noted numbness in her left side of her face and then noted that she cannot close her left eye and cannot smile but the left side of her face.  She has had Bell's palsy 3 times before.  This is the way it is presented each time.   Last menstrual cycle was April 12. She is not allergic to any medications  Past Medical History:  Diagnosis Date   Bell palsy     Patient Active Problem List   Diagnosis Date Noted   VAGINAL DISCHARGE 07/21/2010   DYSURIA 12/04/2009   OTHER NONSPECIFIC FINDING EXAMINATION OF URINE 12/04/2009   BREAST CYST, RIGHT 11/04/2009   BELLS PALSY 02/21/2009   DERMATOPHYTOSIS OF FOOT 01/01/2009   OBESITY 01/01/2009    History reviewed. No pertinent surgical history.  OB History     Gravida  2   Para      Term      Preterm      AB  1   Living         SAB      IAB  1   Ectopic      Multiple      Live Births               Home Medications    Prior to Admission medications   Medication Sig Start Date End Date Taking? Authorizing Provider  predniSONE (DELTASONE) 20 MG tablet Take 3 tablets (60 mg total) by mouth daily with breakfast for 7 days. 10/03/22 10/10/22 Yes Kaydon Creedon, Janace Aris, MD  valACYclovir (VALTREX) 1000 MG tablet Take 1 tablet (1,000 mg total) by mouth 3 (three) times daily for 7 days. 10/03/22 10/10/22 Yes Zenia Resides, MD  ciprofloxacin (CILOXAN) 0.3 %  ophthalmic solution PLEASE SEE ATTACHED FOR DETAILED DIRECTIONS 05/26/22   [provider]    Family History History reviewed. No pertinent family history.  Social History Social History   Tobacco Use   Smoking status: Never   Smokeless tobacco: Never  Vaping Use   Vaping Use: Never used  Substance Use Topics   Alcohol use: Not Currently    Comment: occasionally   Drug use: No     Allergies   Patient has no known allergies.   Review of Systems Review of Systems   Physical Exam Triage Vital Signs ED Triage Vitals  Enc Vitals Group     BP 10/03/22 1038 118/87     Pulse Rate 10/03/22 1038 94     Resp 10/03/22 1038 18     Temp 10/03/22 1038 98.3 F (36.8 C)     Temp Source 10/03/22 1038 Oral     SpO2 10/03/22 1038 98 %     Weight 10/03/22 1035 273 lb (123.8 kg)     Height 10/03/22 1035  (1.702 m)  Head Circumference --      Peak Flow --      Pain Score 10/03/22 1034 7     Pain Loc --      Pain Edu? --      Excl. in GC? --    No data found.  Updated Vital Signs BP 118/87 (BP Location: Left Arm)   Pulse 94   Temp 98.3 F (36.8 C) (Oral)   Resp 18   Ht 5\' 7"  (1.702 m)   Wt 123.8 kg   LMP 09/24/2022 (Exact Date) Comment: "normally mid month"  SpO2 98%   BMI 42.76 kg/m   Visual Acuity Right Eye Distance:   Left Eye Distance:   Bilateral Distance:    Right Eye Near:   Left Eye Near:    Bilateral Near:     Physical Exam Vitals reviewed.  Constitutional:      General: She is not in acute distress.    Appearance: She is not ill-appearing, toxic-appearing or diaphoretic.  HENT:     Left Ear: Tympanic membrane and ear canal normal.     Nose: Nose normal.     Mouth/Throat:     Mouth: Mucous membranes are moist.     Pharynx: No oropharyngeal exudate or posterior oropharyngeal erythema.  Eyes:     Extraocular Movements: Extraocular movements intact.     Conjunctiva/sclera: Conjunctivae normal.     Pupils: Pupils are equal, round,  and reactive to light.  Cardiovascular:     Rate and Rhythm: Normal rate and regular rhythm.  Pulmonary:     Effort: Pulmonary effort is normal.     Breath sounds: Normal breath sounds. No stridor. No wheezing, rhonchi or rales.  Neurological:     Mental Status: She is alert and oriented to person, place, and time.     Comments: She cannot recall her frontal facial muscles.  There is left facial droop.  She cannot close her left eye against resistance  Psychiatric:        Behavior: Behavior normal.      UC Treatments / Results  Labs (all labs ordered are listed, but only abnormal results are displayed) Labs Reviewed - No data to display  EKG   Radiology No results found.  Procedures Procedures (including critical care time)  Medications Ordered in UC Medications - No data to display  Initial Impression / Assessment and Plan / UC Course  I have reviewed the triage vital signs and the nursing notes.  Pertinent labs & imaging results that were available during my care of the patient were reviewed by me and considered in my medical decision making (see chart for details).        7 days of prednisone and Valtrex are sent in.  We discussed taping her eye shut.  She does not currently have a primary care, still she is given instructions on self scheduling on the [facility] health website. Final Clinical Impressions(s) / UC Diagnoses   Final diagnoses:  Bell's palsy     Discharge Instructions      Take prednisone 20 mg--3 daily for 7 days  Take valacyclovir 1000 mg--1 tablet 3 times daily for 7 days  Use artificial tears eyedrops to lubricate your left eye during the day, and then use a lubricating eye ointment in your left eye at night and take it at night.  You can use the QR code/website at the back of the summary paperwork to schedule yourself a new patient appointment with primary care  ED Prescriptions     Medication Sig Dispense Auth. Provider    predniSONE (DELTASONE) 20 MG tablet Take 3 tablets (60 mg total) by mouth daily with breakfast for 7 days. 21 tablet Fallou Hulbert, Janace Aris, MD   valACYclovir (VALTREX) 1000 MG tablet Take 1 tablet (1,000 mg total) by mouth 3 (three) times daily for 7 days. 21 tablet Verina Galeno, Janace Aris, MD      PDMP not reviewed this encounter.   Zenia Resides, MD 10/03/22 1110

## 2022-10-03 NOTE — ED Triage Notes (Signed)
Started with headache "always behind left ear". "Yesterday face went numb on left side". "I noticed when I was talking and laughing that there was a difference in speech. History of Bell's Palsy (x4 episodes in past). "I did have a little cold or allergies just prior to this". No chest Pain. No sob. No dizziness.

## 2022-11-16 ENCOUNTER — Other Ambulatory Visit: Payer: Self-pay

## 2022-11-16 ENCOUNTER — Ambulatory Visit (HOSPITAL_COMMUNITY)
Admission: RE | Admit: 2022-11-16 | Discharge: 2022-11-16 | Disposition: A | Discharge status: Home / Self Care | Payer: Self-pay | Source: Ambulatory Visit | Attending: Emergency Medicine | Admitting: Emergency Medicine

## 2022-11-16 ENCOUNTER — Encounter (HOSPITAL_COMMUNITY): Payer: Self-pay

## 2022-11-16 VITALS — BP 131/86 | HR 90 | Temp 99.9°F | Resp 20

## 2022-11-16 DIAGNOSIS — R600 Localized edema: Secondary | ICD-10-CM | POA: Insufficient documentation

## 2022-11-16 LAB — BASIC METABOLIC PANEL
Anion gap: 8 (ref 5–15)
BUN: 11 mg/dL (ref 6–20)
CO2: 23 mmol/L (ref 22–32)
Calcium: 8.8 mg/dL — ABNORMAL LOW (ref 8.9–10.3)
Chloride: 104 mmol/L (ref 98–111)
Creatinine, Ser: 0.72 mg/dL (ref 0.44–1.00)
GFR, Estimated: >60 mL/min (ref >60)
Glucose, Bld: 97 mg/dL (ref 70–99)
Potassium: 3.9 mmol/L (ref 3.5–5.1)
Sodium: 135 mmol/L (ref 135–145)

## 2022-11-16 LAB — CBC
HCT: 36.8 % (ref 36.0–46.0)
Hemoglobin: 12.1 g/dL (ref 12.0–15.0)
MCH: 28.1 pg (ref 26.0–34.0)
MCHC: 32.9 g/dL (ref 30.0–36.0)
MCV: 85.6 fL (ref 80.0–100.0)
Platelets: 289 10*3/uL (ref 150–400)
RBC: 4.3 MIL/uL (ref 3.87–5.11)
RDW: 14.7 % (ref 11.5–15.5)
WBC: 8.9 10*3/uL (ref 4.0–10.5)
nRBC: 0 % (ref 0.0–0.2)

## 2022-11-16 NOTE — ED Provider Notes (Signed)
[facility]    CSN: [id number] Arrival date & time: 11/16/22  1319      History   Chief Complaint Chief Complaint  Patient presents with   Leg Swelling    Entered by patient    HPI Valerie Hancock is a 32 y.o. female.   Patient reports to clinic for complaints of lower extremity edema that she noticed after lunch on Sunday.  She noticed that her ankle brace that was tight around her leg.  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  Today she reports improvement in her edema, but is still remains.  She denies any shortness of breath, chest pain or recent illnesses.  Denies any warmth, fevers or blisters to the area.  She does not take any daily medications. Did take 7 days of prednisone after 10/03/22 visit.     The history is provided by the patient and medical records.    Past Medical History:  Diagnosis Date   Bell palsy     Patient Active Problem List   Diagnosis Date Noted   VAGINAL DISCHARGE 07/21/2010   DYSURIA 12/04/2009   OTHER NONSPECIFIC FINDING EXAMINATION OF URINE 12/04/2009   BREAST CYST, RIGHT 11/04/2009   BELLS PALSY 02/21/2009   DERMATOPHYTOSIS OF FOOT 01/01/2009   OBESITY 01/01/2009    History reviewed. No pertinent surgical history.  OB History     Gravida  2   Para      Term      Preterm      AB  1   Living         SAB      IAB  1   Ectopic      Multiple      Live Births               Home Medications    Prior to Admission medications   Not on File    Family History History reviewed. No pertinent family history.  Social History Social History   Tobacco Use   Smoking status: Never   Smokeless tobacco: Never  Vaping Use   Vaping Use: Never used  Substance Use Topics   Alcohol use: Not Currently    Comment: occasionally   Drug use: No     Allergies   Patient has no known allergies.   Review of Systems Review of Systems  Respiratory:   Negative for cough and shortness of breath.   Cardiovascular:  Positive for leg swelling. Negative for chest pain.     Physical Exam Triage Vital Signs ED Triage Vitals  Enc Vitals Group     BP 11/16/22 1345 131/86     Pulse Rate 11/16/22 1345 90     Resp 11/16/22 1345 20     Temp 11/16/22 1345 99.9 F (37.7 C)     Temp src --      SpO2 11/16/22 1345 98 %     Weight --      Height --      Head Circumference --      Peak Flow --      Pain Score 11/16/22 1344 5     Pain Loc --      Pain Edu? --      Excl. in GC? --    No data found.  Updated Vital Signs BP 131/86   Pulse 90   Temp 99.9 F (37.7 C)   Resp  20   LMP 11/12/2022   SpO2 98%   Visual Acuity Right Eye Distance:   Left Eye Distance:   Bilateral Distance:    Right Eye Near:   Left Eye Near:    Bilateral Near:     Physical Exam Vitals and nursing note reviewed.  Constitutional:      Appearance: Normal appearance.  HENT:     Head: Normocephalic and atraumatic.     Right Ear: External ear normal.     Left Ear: External ear normal.     Nose: Nose normal.     Mouth/Throat:     Mouth: Mucous membranes are moist.  Eyes:     Conjunctiva/sclera: Conjunctivae normal.  Cardiovascular:     Rate and Rhythm: Normal rate and regular rhythm.     Heart sounds: Normal heart sounds. No murmur heard. Pulmonary:     Effort: Pulmonary effort is normal. No respiratory distress.     Breath sounds: Normal breath sounds.  Musculoskeletal:        General: No tenderness, deformity or signs of injury. Normal range of motion.     Right lower leg: 1+ Pitting Edema present.     Left lower leg: 1+ Pitting Edema present.  Skin:    General: Skin is warm and dry.     Capillary Refill: Capillary refill takes less than 2 seconds.     Findings: No erythema or rash.  Neurological:     General: No focal deficit present.     Mental Status: She is alert and oriented to person, place, and time.  Psychiatric:        Mood and  Affect: Mood normal.        Behavior: Behavior normal. Behavior is cooperative.      UC Treatments / Results  Labs (all labs ordered are listed, but only abnormal results are displayed) Labs Reviewed  CBC  BASIC METABOLIC PANEL    EKG   Radiology No results found.  Procedures Procedures (including critical care time)  Medications Ordered in UC Medications - No data to display  Initial Impression / Assessment and Plan / UC Course  I have reviewed the triage vital signs and the nursing notes.  Pertinent labs & imaging results that were available during my care of the patient were reviewed by me and considered in my medical decision making (see chart for details).  Vitals and triage reviewed, patient is hemodynamically stable.  1+ pitting edema present to ankles bilaterally.  Patient is normotensive, without shortness of breath or chest pain.  Peripheral edema could be caused by renal disease, cirrhosis, fluid overload, chronic venous insufficiency, or hypothyroidism.  Will rule out any emergent metabolic abnormalities with basic labs.  Encouraged symptomatic management with elevation and compression. POC, f/u care and return precautions reviewed, no questions at this time.     Final Clinical Impressions(s) / UC Diagnoses   Final diagnoses:  Bilateral lower extremity edema     Discharge Instructions      You still have some peripheral edema present on your exam.  Please make sure you are elevating your legs when at rest, above the level of your heart.  Please also consider compression stockings, to help with your swelling.  There can be many different causes of peripheral edema including kidney disease, liver disease, sodium or fluid overload, thyroid issues, medications, pregnancy, chronic venous insufficiency, and many other causes. We are checking some basic lab work today we will contact you if anything requires immediate  follow-up.  Please seek immediate care if you  develop chest pain, shortness of breath, worsening of lower extremity edema, or any new concerning symptoms.  It is important that you get your routine health maintenance and follow-up with a primary care provider, who can further assess your peripheral edema.      ED Prescriptions   None    PDMP not reviewed this encounter.   Everado Pillsbury, [place], [place] 11/16/22 [phone]

## 2022-11-16 NOTE — ED Triage Notes (Signed)
Pt reports feet and leg swelling started on 11-14-22. Pt does not have a PCP and was wanting to know if she has High BP, or what is causing the swelling. Skin is tender to touch.

## 2022-11-16 NOTE — Discharge Instructions (Addendum)
You still have some peripheral edema present on your exam.  Please make sure you are elevating your legs when at rest, above the level of your heart.  Please also consider compression stockings, to help with your swelling.  There can be many different causes of peripheral edema including kidney disease, liver disease, sodium or fluid overload, thyroid issues, medications, pregnancy, chronic venous insufficiency, and many other causes. We are checking some basic lab work today we will contact you if anything requires immediate follow-up.  Please seek immediate care if you develop chest pain, shortness of breath, worsening of lower extremity edema, or any new concerning symptoms.  It is important that you get your routine health maintenance and follow-up with a primary care provider, who can further assess your peripheral edema.

## 2022-12-21 ENCOUNTER — Ambulatory Visit: Payer: Self-pay | Admitting: Internal Medicine

## 2023-01-10 ENCOUNTER — Ambulatory Visit: Payer: Self-pay | Admitting: Internal Medicine

## 2023-03-23 ENCOUNTER — Encounter: Payer: Self-pay | Admitting: General Practice

## 2023-04-11 ENCOUNTER — Ambulatory Visit: Payer: Self-pay | Admitting: Family Medicine

## 2023-04-28 ENCOUNTER — Ambulatory Visit: Payer: Medicaid Other | Admitting: Family Medicine

## 2023-05-30 ENCOUNTER — Ambulatory Visit (INDEPENDENT_AMBULATORY_CARE_PROVIDER_SITE_OTHER): Payer: Medicaid Other | Admitting: Family Medicine

## 2023-05-30 ENCOUNTER — Encounter: Payer: Self-pay | Admitting: Family Medicine

## 2023-05-30 VITALS — BP 110/77 | HR 92 | Temp 97.8°F | Resp 18 | Ht 66.0 in | Wt 303.6 lb

## 2023-05-30 DIAGNOSIS — Z130 Encounter for screening for diseases of the blood and blood-forming organs and certain disorders involving the immune mechanism: Secondary | ICD-10-CM | POA: Diagnosis not present

## 2023-05-30 DIAGNOSIS — Z13220 Encounter for screening for lipoid disorders: Secondary | ICD-10-CM

## 2023-05-30 DIAGNOSIS — Z7689 Persons encountering health services in other specified circumstances: Secondary | ICD-10-CM

## 2023-05-30 DIAGNOSIS — Z13228 Encounter for screening for other metabolic disorders: Secondary | ICD-10-CM

## 2023-05-30 DIAGNOSIS — Z1329 Encounter for screening for other suspected endocrine disorder: Secondary | ICD-10-CM | POA: Diagnosis not present

## 2023-05-30 DIAGNOSIS — Z0000 Encounter for general adult medical examination without abnormal findings: Secondary | ICD-10-CM

## 2023-05-30 LAB — POCT URINALYSIS DIP (CLINITEK)
Bilirubin, UA: NEGATIVE
Blood, UA: NEGATIVE
Glucose, UA: NEGATIVE mg/dL
Ketones, POC UA: NEGATIVE mg/dL
Leukocytes, UA: NEGATIVE
Nitrite, UA: POSITIVE — AB
Spec Grav, UA: >=1.03 — AB (ref 1.010–1.025)
Urobilinogen, UA: 0.2 U/dL
pH, UA: 6.5 (ref 5.0–8.0)

## 2023-05-31 ENCOUNTER — Encounter: Payer: Self-pay | Admitting: Family Medicine

## 2023-05-31 LAB — LIPID PANEL
Chol/HDL Ratio: 4.1 {ratio} (ref 0.0–4.4)
Cholesterol, Total: 150 mg/dL (ref 100–199)
HDL: 37 mg/dL — ABNORMAL LOW (ref >39)
LDL Chol Calc (NIH): 99 mg/dL (ref 0–99)
Triglycerides: 68 mg/dL (ref 0–149)
VLDL Cholesterol Cal: 14 mg/dL (ref 5–40)

## 2023-05-31 LAB — CBC WITH DIFFERENTIAL/PLATELET
Basophils Absolute: 0.1 10*3/uL (ref 0.0–0.2)
Basos: 1 %
EOS (ABSOLUTE): 0.3 10*3/uL (ref 0.0–0.4)
Eos: 3 %
Hematocrit: 37.7 % (ref 34.0–46.6)
Hemoglobin: 12.1 g/dL (ref 11.1–15.9)
Immature Grans (Abs): 0 10*3/uL (ref 0.0–0.1)
Immature Granulocytes: 0 %
Lymphocytes Absolute: 2.2 10*3/uL (ref 0.7–3.1)
Lymphs: 23 %
MCH: 27.5 pg (ref 26.6–33.0)
MCHC: 32.1 g/dL (ref 31.5–35.7)
MCV: 86 fL (ref 79–97)
Monocytes Absolute: 0.7 10*3/uL (ref 0.1–0.9)
Monocytes: 7 %
Neutrophils Absolute: 6.6 10*3/uL (ref 1.4–7.0)
Neutrophils: 66 %
Platelets: 293 10*3/uL (ref 150–450)
RBC: 4.4 x10E6/uL (ref 3.77–5.28)
RDW: 13.9 % (ref 11.7–15.4)
WBC: 9.8 10*3/uL (ref 3.4–10.8)

## 2023-05-31 LAB — HEMOGLOBIN A1C
Est. average glucose Bld gHb Est-mCnc: 137 mg/dL
Hgb A1c MFr Bld: 6.4 % — ABNORMAL HIGH (ref 4.8–5.6)

## 2023-05-31 LAB — CMP14+EGFR
ALT: 26 [IU]/L (ref 0–32)
AST: 22 [IU]/L (ref 0–40)
Albumin: 4.1 g/dL (ref 3.9–4.9)
Alkaline Phosphatase: 82 [IU]/L (ref 44–121)
BUN/Creatinine Ratio: 12 (ref 9–23)
BUN: 10 mg/dL (ref 6–20)
Bilirubin Total: 0.2 mg/dL (ref 0.0–1.2)
CO2: 21 mmol/L (ref 20–29)
Calcium: 9.1 mg/dL (ref 8.7–10.2)
Chloride: 104 mmol/L (ref 96–106)
Creatinine, Ser: 0.82 mg/dL (ref 0.57–1.00)
Globulin, Total: 3.6 g/dL (ref 1.5–4.5)
Glucose: 102 mg/dL — ABNORMAL HIGH (ref 70–99)
Potassium: 4.4 mmol/L (ref 3.5–5.2)
Sodium: 140 mmol/L (ref 134–144)
Total Protein: 7.7 g/dL (ref 6.0–8.5)
eGFR: 97 mL/min/{1.73_m2} (ref >59)

## 2023-05-31 LAB — TSH: TSH: 2.32 u[IU]/mL (ref 0.450–4.500)

## 2023-05-31 MED ORDER — NITROFURANTOIN MONOHYD MACRO 100 MG PO CAPS: 100.0000 mg | ORAL_CAPSULE | Freq: Two times a day (BID) | ORAL | 0 refills | Status: DC

## 2023-05-31 NOTE — Progress Notes (Signed)
New Patient Office Visit  Subjective    Patient ID: Valerie Hancock, female    DOB: [birth year]  Age: 32 y.o. MRN: [id number]  CC:  Chief Complaint  Patient presents with   Establish Care    Check for diabetes    HPI Valerie Hancock presents to establish care and for routine annual exam. Patient denies any known chronic med issues and does not take any meds on a regular basis. Patient denies acute complaints.    No outpatient encounter medications on file as of 05/30/2023.   No facility-administered encounter medications on file as of 05/30/2023.    Past Medical History:  Diagnosis Date   Bell palsy     History reviewed. No pertinent surgical history.  History reviewed. No pertinent family history.  Social History   Socioeconomic History   Marital status: Single    Spouse name: Not on file   Number of children: 0   Years of education: 12   Highest education level: Not on file  Occupational History   Not on file  Tobacco Use   Smoking status: Never   Smokeless tobacco: Never  Vaping Use   Vaping status: Never Used  Substance and Sexual Activity   Alcohol use: Not Currently    Comment: occasionally   Drug use: No   Sexual activity: Yes    Birth control/protection: None  Other Topics Concern   Not on file  Social History Narrative   Not on file   Social Drivers of Health   Financial Resource Strain: Low Risk  (05/30/2023)   Overall Financial Resource Strain (CARDIA)    Difficulty of Paying Living Expenses: Not hard at all  Food Insecurity: No Food Insecurity (05/30/2023)   Hunger Vital Sign    Worried About Running Out of Food in the Last Year: Never true    Ran Out of Food in the Last Year: Never true  Transportation Needs: No Transportation Needs (05/30/2023)   PRAPARE - Administrator, Civil Service (Medical): No    Lack of Transportation (Non-Medical): No  Physical Activity: Sufficiently Active (05/30/2023)   Exercise Vital Sign     Days of Exercise per Week: 5 days    Minutes of Exercise per Session: 30 min  Stress: No Stress Concern Present (05/30/2023)   [employer] of Occupational Health - Occupational Stress Questionnaire    Feeling of Stress : Not at all  Social Connections: Moderately Integrated (05/30/2023)   Social Connection and Isolation Panel [NHANES]    Frequency of Communication with Friends and Family: More than three times a week    Frequency of Social Gatherings with Friends and Family: Twice a week    Attends Religious Services: 1 to 4 times per year    Active Member of [employer] or Organizations: No    Attends Banker Meetings: Never    Marital Status: Living with partner  Intimate Partner Violence: Not At Risk (05/30/2023)   Humiliation, Afraid, Rape, and Kick questionnaire    Fear of Current or Ex-Partner: No    Emotionally Abused: No    Physically Abused: No    Sexually Abused: No    Review of Systems  All other systems reviewed and are negative.       Objective   BP 110/77 (BP Location: Left Arm, Patient Position: Sitting, Cuff Size: Large)   Pulse 92   Temp 97.8 F (36.6 C) (Oral)   Resp 18   Ht 5\' 6"  (  1.676 m)   Wt (!) 303 lb 9.6 oz (137.7 kg)   SpO2 98%   BMI 49.00 kg/m   Physical Exam Vitals and nursing note reviewed.  Constitutional:      General: She is not in acute distress.    Appearance: She is obese.  HENT:     Head: Normocephalic and atraumatic.     Right Ear: Tympanic membrane, ear canal and external ear normal.     Left Ear: Tympanic membrane, ear canal and external ear normal.     Nose: Nose normal.     Mouth/Throat:     Mouth: Mucous membranes are moist.     Pharynx: Oropharynx is clear.  Eyes:     Conjunctiva/sclera: Conjunctivae normal.     Pupils: Pupils are equal, round, and reactive to light.  Neck:     Thyroid: No thyromegaly.  Cardiovascular:     Rate and Rhythm: Normal rate and regular rhythm.     Heart sounds: Normal heart  sounds. No murmur heard. Pulmonary:     Effort: Pulmonary effort is normal. No respiratory distress.     Breath sounds: Normal breath sounds.  Abdominal:     General: There is no distension.     Palpations: Abdomen is soft. There is no mass.     Tenderness: There is no abdominal tenderness.  Musculoskeletal:        General: Normal range of motion.     Cervical back: Normal range of motion and neck supple.  Skin:    General: Skin is warm and dry.  Neurological:     General: No focal deficit present.     Mental Status: She is alert and oriented to person, place, and time.  Psychiatric:        Mood and Affect: Mood normal.        Behavior: Behavior normal.         Assessment & Plan:   Annual physical exam -     CMP14+EGFR -     POCT URINALYSIS DIP (CLINITEK)  Screening for deficiency anemia -     CBC with Differential/Platelet  Screening for lipid disorders -     Lipid panel  Screening for endocrine/metabolic/immunity disorders -     Hemoglobin A1c -     TSH  Encounter to establish care     Return in about 1 year (around 05/29/2024) for physical.   Tommie Raymond, MD

## 2023-09-03 ENCOUNTER — Other Ambulatory Visit: Payer: Self-pay

## 2023-09-03 ENCOUNTER — Emergency Department (HOSPITAL_COMMUNITY)
Admission: EM | Admit: 2023-09-03 | Discharge: 2023-09-04 | Disposition: A | Discharge status: Home / Self Care | Attending: Emergency Medicine | Admitting: Emergency Medicine

## 2023-09-03 ENCOUNTER — Encounter (HOSPITAL_COMMUNITY): Payer: Self-pay | Admitting: Emergency Medicine

## 2023-09-03 ENCOUNTER — Emergency Department (HOSPITAL_COMMUNITY)

## 2023-09-03 DIAGNOSIS — S93401A Sprain of unspecified ligament of right ankle, initial encounter: Secondary | ICD-10-CM | POA: Insufficient documentation

## 2023-09-03 DIAGNOSIS — X501XXA Overexertion from prolonged static or awkward postures, initial encounter: Secondary | ICD-10-CM | POA: Diagnosis not present

## 2023-09-03 DIAGNOSIS — M25571 Pain in right ankle and joints of right foot: Secondary | ICD-10-CM | POA: Diagnosis present

## 2023-09-03 NOTE — ED Triage Notes (Signed)
 Pt in with R ankle pain, states at 4pm she was loading her trunk and her foot slipped off the curb and she rolled her ankle. Ambulatory into triage, painful when bearing weight

## 2023-09-04 MED ORDER — IBUPROFEN 800 MG PO TABS: 800.0000 mg | ORAL_TABLET | Freq: Three times a day (TID) | ORAL | 0 refills | Status: DC

## 2023-09-04 MED ORDER — IBUPROFEN 800 MG PO TABS
800.0000 mg | ORAL_TABLET | Freq: Once | ORAL | Status: AC
  Administered 2023-09-04: 800 mg via ORAL
  Filled 2023-09-04: qty 1

## 2023-09-04 NOTE — ED Provider Notes (Signed)
 [facility] Emergency Department Provider Note MRN:  [id number]  Arrival date & time: 09/04/23     Chief Complaint   Ankle Pain   History of Present Illness   Valerie Hancock is a 33 y.o. year-old female presents to the ED with chief complaint of right ankle sprain.  She states that she stepped off a curb and rolled her ankle.  She felt a pop.  She states that she hasn't been able to walk since.  She denies any other injuries.  History provided by patient.   Review of Systems  Pertinent positive and negative review of systems noted in HPI.    Physical Exam   Vitals:   09/03/23 2205  BP: 135/88  Pulse: 100  Resp: 15  Temp: 98.7 F (37.1 C)  SpO2: 99%    CONSTITUTIONAL:  non toxic-appearing, NAD NEURO:  Alert and oriented x 3, CN 3-12 grossly intact EYES:  eyes equal and reactive ENT/NECK:  Supple, no stridor  CARDIO:  normal rate, regular rhythm, appears well-perfused  PULM:  No respiratory distress, CTAB GI/GU:  non-distended,  MSK/SPINE:  No gross deformities, there is swelling and tenderness over the right lateral ankle SKIN:  no rash, atraumatic   *Additional and/or pertinent findings included in MDM below  Diagnostic and Interventional Summary    EKG Interpretation Date/Time:    Ventricular Rate:    PR Interval:    QRS Duration:    QT Interval:    QTC Calculation:   R Axis:      Text Interpretation:         Labs Reviewed - No data to display  DG Ankle Complete Right  Final Result      Medications  ibuprofen (ADVIL) tablet 800 mg (has no administration in time range)     Procedures  /  Critical Care Procedures  ED Course and Medical Decision Making  I have reviewed the triage vital signs, the nursing notes, and pertinent available records from the EMR.  Social Determinants Affecting Complexity of Care: Patient has no clinically significant social determinants affecting this chief complaint..   ED Course:     Medical Decision Making Patient presents with injury to right ankle.  DDx includes, fracture, strain, or sprain.  Consultants: none  Plain films reveal no fracture.  Pt advised to follow up with PCP and/or orthopedics. Patient given cam boot and crutches while in ED, conservative therapy such as RICE recommended and discussed.   Patient will be discharged home & is agreeable with above plan. Returns precautions discussed. Pt appears safe for discharge.   Amount and/or Complexity of Data Reviewed Radiology: ordered.  Risk Prescription drug management.         Consultants: No consultations were needed in caring for this patient.   Treatment and Plan: Emergency department workup does not suggest an emergent condition requiring admission or immediate intervention beyond  what has been performed at this time. The patient is safe for discharge and has  been instructed to return immediately for worsening symptoms, change in  symptoms or any other concerns    Final Clinical Impressions(s) / ED Diagnoses     ICD-10-CM   1. Sprain of right ankle, unspecified ligament, initial encounter  S93.401A       ED Discharge Orders          Ordered    ibuprofen (ADVIL) 800 MG tablet  3 times daily        09/04/23 0127  Discharge Instructions Discussed with and Provided to Patient:   Discharge Instructions   None      Roxy Horseman, PA-C 09/04/23 0158    Zadie Rhine, MD 09/04/23 [phone]

## 2023-09-04 NOTE — ED Notes (Signed)
 Ortho tech at bedside

## 2023-09-04 NOTE — Progress Notes (Signed)
 Orthopedic Tech Progress Note Patient Details:  Valerie Hancock 1991-05-12 [id number]  Ortho Devices Type of Ortho Device: Crutches, CAM walker Ortho Device/Splint Location: rle Ortho Device/Splint Interventions: Ordered, Application, Adjustment   Post Interventions Patient Tolerated: Well Instructions Provided: Care of device, Adjustment of device  Trinna Post 09/04/2023, 4:01 AM

## 2024-01-03 ENCOUNTER — Ambulatory Visit: Admitting: Primary Care

## 2024-01-03 NOTE — Progress Notes (Deleted)
 @  Patient ID: Valerie Hancock, female    DOB: [birth year], 33 y.o.   MRN: [id number]  No chief complaint on file.   Referring provider: Tanda Bleacher, MD  HPI:  33 year old female, never smoked. PMH significant for bells palsy, obesity.     01/03/2024 Patient presents today for sleep consult due to snoring. No prior sleep study.        No Known Allergies  Immunization History  Administered Date(s) Administered   Influenza Whole 07/21/2010    Past Medical History:  Diagnosis Date   Bell palsy     Tobacco History: Social History   Tobacco Use  Smoking Status Never  Smokeless Tobacco Never   Counseling given: Not Answered   Outpatient Medications Prior to Visit  Medication Sig Dispense Refill   ibuprofen  (ADVIL ) 800 MG tablet Take 1 tablet (800 mg total) by mouth 3 (three) times daily. 21 tablet 0   nitrofurantoin , macrocrystal-monohydrate, (MACROBID ) 100 MG capsule Take 1 capsule (100 mg total) by mouth 2 (two) times daily. 6 capsule 0   No facility-administered medications prior to visit.      Review of Systems  Review of Systems   Physical Exam  There were no vitals taken for this visit. Physical Exam   Lab Results:  CBC    Component Value Date/Time   WBC 9.8 05/30/2023 1340   WBC 8.9 11/16/2022 1354   RBC 4.40 05/30/2023 1340   RBC 4.30 11/16/2022 1354   HGB 12.1 05/30/2023 1340   HCT 37.7 05/30/2023 1340   PLT 293 05/30/2023 1340   MCV 86 05/30/2023 1340   MCH 27.5 05/30/2023 1340   MCH 28.1 11/16/2022 1354   MCHC 32.1 05/30/2023 1340   MCHC 32.9 11/16/2022 1354   RDW 13.9 05/30/2023 1340   LYMPHSABS 2.2 05/30/2023 1340   MONOABS 0.7 06/22/2021 0600   EOSABS 0.3 05/30/2023 1340   BASOSABS 0.1 05/30/2023 1340    BMET    Component Value Date/Time   NA 140 05/30/2023 1340   K 4.4 05/30/2023 1340   CL 104 05/30/2023 1340   CO2 21 05/30/2023 1340   GLUCOSE 102 (H) 05/30/2023 1340   GLUCOSE 97 11/16/2022 1354   BUN 10  05/30/2023 1340   CREATININE 0.82 05/30/2023 1340   CALCIUM 9.1 05/30/2023 1340   GFRNONAA >60 11/16/2022 1354   GFRAA >60 05/18/2016 1712    BNP No results found for: BNP  ProBNP No results found for: PROBNP  Imaging: No results found.   Assessment & Plan:   No problem-specific Assessment & Plan notes found for this encounter.     Valerie LELON Ferrari, NP 01/03/2024

## 2024-02-01 ENCOUNTER — Ambulatory Visit: Admitting: Podiatry

## 2024-02-06 ENCOUNTER — Ambulatory Visit: Admitting: Podiatry

## 2024-02-06 ENCOUNTER — Ambulatory Visit (INDEPENDENT_AMBULATORY_CARE_PROVIDER_SITE_OTHER)

## 2024-02-06 ENCOUNTER — Encounter: Payer: Self-pay | Admitting: Podiatry

## 2024-02-06 DIAGNOSIS — M722 Plantar fascial fibromatosis: Secondary | ICD-10-CM | POA: Diagnosis not present

## 2024-02-06 DIAGNOSIS — M67472 Ganglion, left ankle and foot: Secondary | ICD-10-CM

## 2024-02-06 DIAGNOSIS — Z8669 Personal history of other diseases of the nervous system and sense organs: Secondary | ICD-10-CM | POA: Insufficient documentation

## 2024-02-06 MED ORDER — BETAMETHASONE SOD PHOS & ACET 6 (3-3) MG/ML IJ SUSP
3.0000 mg | Freq: Once | INTRAMUSCULAR | Status: AC
  Administered 2024-02-06: 3 mg via INTRA_ARTICULAR

## 2024-02-06 NOTE — Progress Notes (Signed)
   Chief Complaint  Patient presents with   Foot Pain    Dorsal midfoot left - knot x 6 months or better, tender at times, depends on the shoe, same size-no change   New Patient (Initial Visit)   Callouses    Trim callus right     Subjective: 33 y.o. female presenting today as a reestablish new patient for evaluation of pain and tenderness to the right plantar heel as well as a ganglion cyst that developed to the dorsum of the left midfoot   Past Medical History:  Diagnosis Date   Bell palsy     No past surgical history on file.   Objective: Physical Exam General: The patient is alert and oriented x3 in no acute distress.  Dermatology: Skin is warm, dry and supple bilateral lower extremities. Negative for open lesions or macerations bilateral.   Vascular: Dorsalis Pedis and Posterior Tibial pulses palpable bilateral.  Capillary fill time is immediate to all digits.  Neurological: Grossly intact via light touch  Musculoskeletal: Tenderness to palpation to the plantar aspect of the right heel along the plantar fascia. All other joints range of motion within normal limits bilateral. Strength 5/5 in all groups bilateral.  Not adhered nodular ganglion cyst also noted to the dorsum of the left foot  Radiographic exam LT foot 02/06/2024: Normal osseous mineralization. Joint spaces preserved. No fracture/dislocation/boney destruction. No other soft tissue abnormalities or radiopaque foreign bodies.   Assessment: 1. Plantar fasciitis right 2.  Ganglion cyst dorsum of the left midfoot  Plan of Care:  -Patient evaluated. Xrays reviewed.   -Pressure was applied to the ganglion cyst of the dorsum of the left midfoot however it is very nodular and I was unable to have the lesion rupture and resorb into the surrounding soft tissue.  I did not see any benefit as well of aspiration since it has been  present for so long.  For now it is minimally symptomatic so we will simply  observe -Injection of 0.5cc Celestone  soluspan injected into the right plantar fascia  -Prescription for meloxicam  15 mg daily after completion of the Dosepak -Advised against going barefoot.  Recommended supportive tennis shoes and sneakers -Recommend daily calf stretching, specifically calf stretches to alleviate posterior calf tightness -Return to clinic PRN   Thresa EMERSON Sar, DPM [facility]  Dr. Thresa EMERSON Sar, DPM    [address] [place], [place] [address]                Office [phone]  Fax [phone]

## 2024-02-08 ENCOUNTER — Telehealth: Payer: Self-pay | Admitting: Lab

## 2024-02-08 ENCOUNTER — Other Ambulatory Visit: Payer: Self-pay | Admitting: Podiatry

## 2024-02-08 MED ORDER — METHYLPREDNISOLONE 4 MG PO TBPK: ORAL_TABLET | ORAL | 0 refills | Status: AC

## 2024-02-08 MED ORDER — MELOXICAM 15 MG PO TABS: 15.0000 mg | ORAL_TABLET | Freq: Every day | ORAL | 1 refills | Status: AC

## 2024-02-08 NOTE — Telephone Encounter (Signed)
 Patient states at pharmacy no medication has been sent please review and advise.

## 2024-05-25 ENCOUNTER — Ambulatory Visit: Admission: EM | Admit: 2024-05-25 | Discharge: 2024-05-25 | Disposition: A | Discharge status: Home / Self Care

## 2024-05-25 ENCOUNTER — Encounter: Payer: Self-pay | Admitting: Emergency Medicine

## 2024-05-25 DIAGNOSIS — H1031 Unspecified acute conjunctivitis, right eye: Secondary | ICD-10-CM | POA: Diagnosis not present

## 2024-05-25 MED ORDER — CIPROFLOXACIN HCL 0.3 % OP SOLN: 1.0000 [drp] | OPHTHALMIC | 0 refills | Status: AC

## 2024-05-25 NOTE — ED Provider Notes (Signed)
 [facility]    CSN: [id number] Arrival date & time: 05/25/24  1350      History   Chief Complaint Chief Complaint  Patient presents with   Conjunctivitis    R eye     HPI Valerie Hancock is a 33 y.o. female.   Pt presents today due to waking up with erythematous right eye with purulent drainage and sensation of debris in the eye. 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   The history is provided by the patient.  Conjunctivitis    Past Medical History:  Diagnosis Date   Bell palsy     Patient Active Problem List   Diagnosis Date Noted   History of Bell's palsy 02/06/2024   Vaginal leukorrhea 07/21/2010   DYSURIA 12/04/2009   OTHER NONSPECIFIC FINDING EXAMINATION OF URINE 12/04/2009   BREAST CYST, RIGHT 11/04/2009   BELLS PALSY 02/21/2009   DERMATOPHYTOSIS OF FOOT 01/01/2009   OBESITY 01/01/2009    History reviewed. No pertinent surgical history.  OB History     Gravida  2   Para      Term      Preterm      AB  1   Living         SAB      IAB  1   Ectopic      Multiple      Live Births               Home Medications    Prior to Admission medications  Medication Sig Start Date End Date Taking? Authorizing Provider  ciprofloxacin  (CILOXAN ) 0.3 % ophthalmic solution Place 1 drop into both eyes every 2 (two) hours. Administer 1 drop, every 2 hours, while awake, for 2 days. Then 1 drop, every 4 hours, while awake, for the next 5 days. 05/25/24  Yes Andra Corean BROCKS, PA-C  meloxicam  (MOBIC ) 15 MG tablet Take 1 tablet (15 mg total) by mouth daily. 02/08/24 06/07/24  Janit Thresa HERO, DPM  methylPREDNISolone  (MEDROL  DOSEPAK) 4 MG TBPK tablet 6 day dose pack - take as directed 02/08/24   Janit Thresa HERO, DPM    Family History History reviewed. No pertinent family history.  Social History Social History[1]   Allergies   Patient has no known  allergies.   Review of Systems Review of Systems   Physical Exam Triage Vital Signs ED Triage Vitals  Encounter Vitals Group     BP 05/25/24 1410 130/89     Girls Systolic BP Percentile --      Girls Diastolic BP Percentile --      Boys Systolic BP Percentile --      Boys Diastolic BP Percentile --      Pulse Rate 05/25/24 1410 96     Resp 05/25/24 1410 18     Temp 05/25/24 1410 98.8 F (37.1 C)     Temp Source 05/25/24 1410 Oral     SpO2 05/25/24 1410 98 %     Weight 05/25/24 1410 (!) 303 lb 9.2 oz (137.7 kg)     Height --      Head Circumference --      Peak Flow --      Pain Score 05/25/24 1409 8     Pain Loc --      Pain Education --      Exclude from Growth Chart --    No data  found.  Updated Vital Signs BP 130/89 (BP Location: Right Arm)   Pulse 96   Temp 98.8 F (37.1 C) (Oral)   Resp 18   Wt (!) 303 lb 9.2 oz (137.7 kg)   LMP 05/10/2024 (Exact Date)   SpO2 98%   Breastfeeding No   BMI 49.00 kg/m   Visual Acuity Right Eye Distance:   Left Eye Distance:   Bilateral Distance:    Right Eye Near:   Left Eye Near:    Bilateral Near:     Physical Exam Vitals and nursing note reviewed.  Constitutional:      General: She is not in acute distress.    Appearance: Normal appearance. She is not ill-appearing, toxic-appearing or diaphoretic.  Eyes:     General: No scleral icterus.       Right eye: No discharge.        Left eye: No discharge.     Extraocular Movements: Extraocular movements intact.     Pupils: Pupils are equal, round, and reactive to light.     Comments: Moderately erythematous bulbar and palpebral conjunctiva  Cardiovascular:     Rate and Rhythm: Normal rate and regular rhythm.     Heart sounds: Normal heart sounds.  Pulmonary:     Effort: Pulmonary effort is normal. No respiratory distress.     Breath sounds: Normal breath sounds. No wheezing or rhonchi.  Skin:    General: Skin is warm.  Neurological:     Mental Status: She is  alert and oriented to person, place, and time.  Psychiatric:        Mood and Affect: Mood normal.        Behavior: Behavior normal.      UC Treatments / Results  Labs (all labs ordered are listed, but only abnormal results are displayed) Labs Reviewed - No data to display  EKG   Radiology No results found.  Procedures Procedures (including critical care time)  Medications Ordered in UC Medications - No data to display  Initial Impression / Assessment and Plan / UC Course  I have reviewed the triage vital signs and the nursing notes.  Pertinent labs & imaging results that were available during my care of the patient were reviewed by me and considered in my medical decision making (see chart for details).     Final Clinical Impressions(s) / UC Diagnoses   Final diagnoses:  Acute bacterial conjunctivitis of right eye     Discharge Instructions      You have been diagnosed with bacterial conjunctivitis or pinkeye today.  You are contagious until you take eyedrops for 24 hours.  Practice good hand hygiene, use soap and water.  Be sure not to share towels and washcloths, change them daily.  Symptoms should improve in 3 days, but use eyedrops for 7.  If you experience changes in vision you need to follow-up with ophthalmologist as soon as possible.     ED Prescriptions     Medication Sig Dispense Auth. Provider   ciprofloxacin  (CILOXAN ) 0.3 % ophthalmic solution Place 1 drop into both eyes every 2 (two) hours. Administer 1 drop, every 2 hours, while awake, for 2 days. Then 1 drop, every 4 hours, while awake, for the next 5 days. 5 mL Andra Corean BROCKS, PA-C      PDMP not reviewed this encounter.    [1]  Social History Tobacco Use   Smoking status: Never   Smokeless tobacco: Never  Vaping Use   Vaping  status: Never Used  Substance Use Topics   Alcohol use: Not Currently    Comment: occasionally   Drug use: No     Andra Corean BROCKS,  PA-C 05/25/24 1506

## 2024-05-25 NOTE — Discharge Instructions (Addendum)
 You have been diagnosed with bacterial conjunctivitis or pinkeye today.  You are contagious until you take eyedrops for 24 hours.  Practice good hand hygiene, use soap and water .  Be sure not to share towels and washcloths, change them daily.  Symptoms should improve in 3 days, but use eyedrops for 7.  If you experience changes in vision you need to follow-up with ophthalmologist as soon as possible.

## 2024-05-25 NOTE — ED Triage Notes (Signed)
 Pt presents c/o eye concern  x 1 days. Pt states,  My right eye has been red since yesterday. When I got up this morning it gave pink eye. It was crusted white and draining.

## 2024-07-11 ENCOUNTER — Ambulatory Visit: Admitting: Primary Care

## 2024-07-11 ENCOUNTER — Encounter: Payer: Self-pay | Admitting: Primary Care

## 2024-07-11 VITALS — BP 130/72 | HR 79 | Temp 97.5°F | Ht 67.0 in | Wt 301.8 lb

## 2024-07-11 DIAGNOSIS — Z6841 Body Mass Index (BMI) 40.0 and over, adult: Secondary | ICD-10-CM | POA: Diagnosis not present

## 2024-07-11 DIAGNOSIS — E669 Obesity, unspecified: Secondary | ICD-10-CM

## 2024-07-11 DIAGNOSIS — R0683 Snoring: Secondary | ICD-10-CM | POA: Diagnosis not present

## 2024-07-11 NOTE — Progress Notes (Signed)
 "  @Patient  ID: Valerie Hancock, female    DOB: [birth year], 34 y.o.   MRN: [id number]  Chief Complaint  Patient presents with   Consult    Snoring. Witnessed apneas. Never tested or diagnosed with osa     Referring provider: Tanda Bleacher, MD  HPI: 34 year old female, never smoked. PMH significant for obesity.    07/11/2024 Discussed the use of AI scribe software for clinical note transcription with the patient, who gave verbal consent to proceed.  History of Present Illness Valerie Hancock Karmella Bouvier is a 34 year old female who presents with snoring and suspected sleep apnea.  She reports significant snoring and episodes where she is told she is not breathing during sleep. Her sleep schedule is irregular, with bedtime ranging from 11 PM to 4 AM. She falls asleep quickly after working but takes longer if she hasn't worked. She wakes up one to two times per night and starts her day between 5:30 AM and 10:30 AM. She has not undergone a sleep study previously.  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 She experiences slight to moderate daytime sleepiness and has gained 50 pounds over the last two years. She reports restless sleep, waking up gasping or choking, and feeling tired during the day. No family history of sleep apnea. She does not smoke and consumes alcohol occasionally.  She mentions talking in her sleep but denies sleepwalking. She primarily sleeps on her back. She is not currently on any weight loss medications but has recently joined a gym through her mother. No history of seizures, heart failure, cardiac arrhythmias, or lung conditions like COPD or asthma.  Epworth score 9/24. No concern for narcolepsy or cataplexy.   Allergies[1]  Immunization History  Administered Date(s) Administered   Influenza Whole 07/21/2010   Influenza, Seasonal, Injecte,  Preservative Fre 04/25/2024    Past Medical History:  Diagnosis Date   Bell palsy     Tobacco History: Tobacco Use History[2] Counseling given: Not Answered   Outpatient Medications Prior to Visit  Medication Sig Dispense Refill   ciprofloxacin  (CILOXAN ) 0.3 % ophthalmic solution Place 1 drop into both eyes every 2 (two) hours. Administer 1 drop, every 2 hours, while awake, for 2 days. Then 1 drop, every 4 hours, while awake, for the next 5 days. (Patient not taking: Reported on 07/11/2024) 5 mL 0   methylPREDNISolone  (MEDROL  DOSEPAK) 4 MG TBPK tablet 6 day dose pack - take as directed (Patient not taking: Reported on 07/11/2024) 21 tablet 0   No facility-administered medications prior to visit.      Review of Systems  Review of Systems  Constitutional: Negative.   Respiratory: Negative.     Physical Exam  BP 130/72   Pulse 79   Temp (!) 97.5 F (36.4 C)   Ht 5' 7 (1.702 m) Comment: pt stated  Wt (!) 301 lb 12.8 oz (136.9 kg)   SpO2 99% Comment: ra  BMI 47.27 kg/m  Physical Exam Constitutional:      Appearance: Normal appearance. She is well-developed.  HENT:     Head: Normocephalic and atraumatic.     Mouth/Throat:     Mouth: Mucous membranes are moist.     Pharynx: Oropharynx is clear.  Eyes:     Pupils: Pupils are equal, round, and reactive to light.  Cardiovascular:  Rate and Rhythm: Normal rate and regular rhythm.     Heart sounds: Normal heart sounds. No murmur heard. Pulmonary:     Effort: Pulmonary effort is normal. No respiratory distress.     Breath sounds: Normal breath sounds. No wheezing or rhonchi.  Musculoskeletal:        General: Normal range of motion.     Cervical back: Normal range of motion and neck supple.  Skin:    General: Skin is warm and dry.     Findings: No erythema or rash.  Neurological:     General: No focal deficit present.     Mental Status: She is alert and oriented to person, place, and time. Mental status is at  baseline.  Psychiatric:        Mood and Affect: Mood normal.        Behavior: Behavior normal.        Thought Content: Thought content normal.        Judgment: Judgment normal.     Lab Results:  CBC    Component Value Date/Time   WBC 9.8 05/30/2023 1340   WBC 8.9 11/16/2022 1354   RBC 4.40 05/30/2023 1340   RBC 4.30 11/16/2022 1354   HGB 12.1 05/30/2023 1340   HCT 37.7 05/30/2023 1340   PLT 293 05/30/2023 1340   MCV 86 05/30/2023 1340   MCH 27.5 05/30/2023 1340   MCH 28.1 11/16/2022 1354   MCHC 32.1 05/30/2023 1340   MCHC 32.9 11/16/2022 1354   RDW 13.9 05/30/2023 1340   LYMPHSABS 2.2 05/30/2023 1340   MONOABS 0.7 06/22/2021 0600   EOSABS 0.3 05/30/2023 1340   BASOSABS 0.1 05/30/2023 1340    BMET    Component Value Date/Time   NA 140 05/30/2023 1340   K 4.4 05/30/2023 1340   CL 104 05/30/2023 1340   CO2 21 05/30/2023 1340   GLUCOSE 102 (H) 05/30/2023 1340   GLUCOSE 97 11/16/2022 1354   BUN 10 05/30/2023 1340   CREATININE 0.82 05/30/2023 1340   CALCIUM 9.1 05/30/2023 1340   GFRNONAA >60 11/16/2022 1354   GFRAA >60 05/18/2016 1712    BNP No results found for: BNP  ProBNP No results found for: PROBNP  Imaging: No results found.   Assessment & Plan:   1. Loud snoring (Primary) - Home sleep test; Future  Assessment and Plan Assessment & Plan Loud snoring  Suspected sleep apnea due to snoring, restless sleep, and daytime sleepiness. No family history of sleep apnea. No history of seizures, heart failure, cardiac arrhythmias, or lung conditions. Sleeping on her back increases risk for sleep apnea. Discussed potential risks of untreated sleep apnea, including cardiac arrhythmias, hypertension, pulmonary hypertension, diabetes, and increased risk of accidents due to daytime sleepiness. Explained that mild cases may be managed with positional therapy and weight loss, while moderate to severe cases may require CPAP or oral appliance. CPAP is the gold  standard treatment, with most patients reporting improved sleep quality. Discussed potential use of Zepbound for moderate sleep apnea if weight loss is a priority and CPAP is not preferred. - Ordered home sleep study through [facility]. - Provided handouts on sleep study, Zepbound, and insurance coverage. - Advised on positional sleep and weight loss for mild sleep apnea. - Discussed potential use of CPAP or oral appliance for moderate to severe sleep apnea. - Advised on moderation of alcohol consumption and sleeping on side if alcohol is consumed.  Obesity Weight gain of 50 pounds over the last  two years. Discussed potential impact of weight on sleep apnea and overall health. Consideration of weight loss as a management strategy for sleep apnea. Discussed Zepbound as a potential treatment option for moderate sleep apnea and obesity, with insurance coverage and self-pay options discussed. - Encouraged weight loss through lifestyle modifications. - Discussed potential use of Zepbound for weight loss and sleep apnea management.    Almarie LELON Ferrari, NP 07/11/2024     [1] No Known Allergies [2]  Social History Tobacco Use  Smoking Status Never  Smokeless Tobacco Never   "

## 2024-07-11 NOTE — Patient Instructions (Signed)
" °  VISIT SUMMARY: Today, we discussed your concerns about snoring and suspected sleep apnea. You reported significant snoring, episodes of not breathing during sleep, restless sleep, and daytime sleepiness. We also talked about your recent weight gain and its potential impact on your sleep and overall health.  YOUR PLAN: -SUSPECTED OBSTRUCTIVE SLEEP APNEA: Obstructive sleep apnea is a condition where your airway becomes blocked during sleep, causing breathing pauses and restless sleep. We discussed the risks of untreated sleep apnea, including heart problems, high blood pressure, diabetes, and accidents due to daytime sleepiness. We talked about managing mild cases with positional therapy and weight loss, while moderate to severe cases may require CPAP or an oral appliance. We ordered a home sleep study to confirm the diagnosis and provided information on sleep studies, Zepbound, and insurance coverage. You were advised to sleep on your side and moderate alcohol consumption.  -OBESITY: Obesity is a condition where you have excess body weight, which can impact your sleep apnea and overall health. We discussed the importance of weight loss as a strategy to manage sleep apnea and improve your health. We talked about lifestyle modifications for weight loss and the potential use of Zepbound for both weight loss and sleep apnea management.  INSTRUCTIONS: Please complete the home sleep study as ordered. Follow the advice on positional sleep and weight loss. Consider moderating alcohol consumption and try to sleep on your side if you do drink. We will review the results of your sleep study at your next appointment.  Orders: Home sleep test    Follow-up: Call 2-3 weeks after completing home sleep study for results / if you have sleep apnea we will set up a virtual visit to discuss CPAP if needed or weight loss options     Contains text generated by [employer].   "

## 2024-08-01 ENCOUNTER — Encounter
